# Patient Record
Sex: Female | Born: 1970 | Race: White | Hispanic: No | Marital: Married | Smoking: Former smoker
Health system: Southern US, Community
[De-identification: ages and names within clinical notes are randomized; demographics above are authoritative.]

## PROBLEM LIST (undated history)

## (undated) DIAGNOSIS — I10 Essential (primary) hypertension: Secondary | ICD-10-CM

## (undated) DIAGNOSIS — E7800 Pure hypercholesterolemia, unspecified: Secondary | ICD-10-CM

## (undated) DIAGNOSIS — E559 Vitamin D deficiency, unspecified: Secondary | ICD-10-CM

## (undated) DIAGNOSIS — G629 Polyneuropathy, unspecified: Secondary | ICD-10-CM

## (undated) DIAGNOSIS — M797 Fibromyalgia: Secondary | ICD-10-CM

## (undated) DIAGNOSIS — R011 Cardiac murmur, unspecified: Secondary | ICD-10-CM

## (undated) DIAGNOSIS — E119 Type 2 diabetes mellitus without complications: Secondary | ICD-10-CM

## (undated) DIAGNOSIS — E271 Primary adrenocortical insufficiency: Secondary | ICD-10-CM

## (undated) DIAGNOSIS — M539 Dorsopathy, unspecified: Secondary | ICD-10-CM

## (undated) DIAGNOSIS — N8300 Follicular cyst of ovary, unspecified side: Secondary | ICD-10-CM

## (undated) DIAGNOSIS — E063 Autoimmune thyroiditis: Secondary | ICD-10-CM

## (undated) DIAGNOSIS — Z973 Presence of spectacles and contact lenses: Secondary | ICD-10-CM

## (undated) DIAGNOSIS — F329 Major depressive disorder, single episode, unspecified: Secondary | ICD-10-CM

## (undated) DIAGNOSIS — D649 Anemia, unspecified: Secondary | ICD-10-CM

## (undated) DIAGNOSIS — M419 Scoliosis, unspecified: Secondary | ICD-10-CM

## (undated) DIAGNOSIS — M1990 Unspecified osteoarthritis, unspecified site: Secondary | ICD-10-CM

## (undated) HISTORY — PX: CHOLECYSTECTOMY: SHX55

---

## 2003-08-17 ENCOUNTER — Encounter: Admission: RE | Admit: 2003-08-17 | Discharge: 2003-11-15 | Payer: Self-pay | Admitting: Endocrinology

## 2004-08-08 ENCOUNTER — Ambulatory Visit: Payer: Self-pay | Admitting: Unknown Physician Specialty

## 2004-08-09 ENCOUNTER — Ambulatory Visit: Payer: Self-pay | Admitting: Unknown Physician Specialty

## 2005-07-03 ENCOUNTER — Ambulatory Visit: Payer: Self-pay | Admitting: Family Medicine

## 2005-08-24 ENCOUNTER — Ambulatory Visit: Payer: Self-pay | Admitting: Family Medicine

## 2005-11-16 ENCOUNTER — Ambulatory Visit: Payer: Self-pay | Admitting: Family Medicine

## 2005-11-19 ENCOUNTER — Encounter: Payer: Self-pay | Admitting: Family Medicine

## 2005-11-19 LAB — CONVERTED CEMR LAB: Hgb A1c MFr Bld: 6.7 %

## 2005-12-11 ENCOUNTER — Ambulatory Visit: Payer: Self-pay | Admitting: Family Medicine

## 2005-12-25 ENCOUNTER — Ambulatory Visit: Payer: Self-pay | Admitting: Family Medicine

## 2006-01-08 ENCOUNTER — Ambulatory Visit: Payer: Self-pay | Admitting: Unknown Physician Specialty

## 2006-01-15 ENCOUNTER — Ambulatory Visit: Payer: Self-pay | Admitting: Unknown Physician Specialty

## 2006-01-25 ENCOUNTER — Ambulatory Visit: Payer: Self-pay | Admitting: Family Medicine

## 2006-01-25 LAB — CONVERTED CEMR LAB: TSH: 1.997 microintl units/mL

## 2006-02-08 ENCOUNTER — Ambulatory Visit: Payer: Self-pay | Admitting: Family Medicine

## 2006-02-19 ENCOUNTER — Ambulatory Visit: Payer: Self-pay | Admitting: Family Medicine

## 2006-04-26 ENCOUNTER — Ambulatory Visit: Payer: Self-pay | Admitting: Family Medicine

## 2006-09-02 ENCOUNTER — Ambulatory Visit: Payer: Self-pay | Admitting: Family Medicine

## 2006-09-25 ENCOUNTER — Ambulatory Visit: Payer: Self-pay | Admitting: Family Medicine

## 2006-09-25 ENCOUNTER — Encounter: Payer: Self-pay | Admitting: Family Medicine

## 2006-09-25 DIAGNOSIS — R809 Proteinuria, unspecified: Secondary | ICD-10-CM | POA: Insufficient documentation

## 2006-09-25 DIAGNOSIS — E069 Thyroiditis, unspecified: Secondary | ICD-10-CM | POA: Insufficient documentation

## 2006-09-25 DIAGNOSIS — R599 Enlarged lymph nodes, unspecified: Secondary | ICD-10-CM | POA: Insufficient documentation

## 2006-09-25 DIAGNOSIS — F329 Major depressive disorder, single episode, unspecified: Secondary | ICD-10-CM

## 2006-09-25 DIAGNOSIS — E785 Hyperlipidemia, unspecified: Secondary | ICD-10-CM | POA: Insufficient documentation

## 2006-09-25 DIAGNOSIS — E063 Autoimmune thyroiditis: Secondary | ICD-10-CM | POA: Insufficient documentation

## 2006-09-25 DIAGNOSIS — N926 Irregular menstruation, unspecified: Secondary | ICD-10-CM | POA: Insufficient documentation

## 2006-09-25 DIAGNOSIS — F32A Depression, unspecified: Secondary | ICD-10-CM | POA: Insufficient documentation

## 2006-09-25 DIAGNOSIS — E119 Type 2 diabetes mellitus without complications: Secondary | ICD-10-CM | POA: Insufficient documentation

## 2006-09-25 DIAGNOSIS — Z87891 Personal history of nicotine dependence: Secondary | ICD-10-CM | POA: Insufficient documentation

## 2006-09-25 DIAGNOSIS — F419 Anxiety disorder, unspecified: Secondary | ICD-10-CM | POA: Insufficient documentation

## 2006-09-25 DIAGNOSIS — F3289 Other specified depressive episodes: Secondary | ICD-10-CM | POA: Insufficient documentation

## 2006-09-25 DIAGNOSIS — I10 Essential (primary) hypertension: Secondary | ICD-10-CM | POA: Insufficient documentation

## 2006-09-25 DIAGNOSIS — F411 Generalized anxiety disorder: Secondary | ICD-10-CM | POA: Insufficient documentation

## 2006-09-25 DIAGNOSIS — F4310 Post-traumatic stress disorder, unspecified: Secondary | ICD-10-CM | POA: Insufficient documentation

## 2006-09-25 DIAGNOSIS — R011 Cardiac murmur, unspecified: Secondary | ICD-10-CM | POA: Insufficient documentation

## 2006-09-25 DIAGNOSIS — J309 Allergic rhinitis, unspecified: Secondary | ICD-10-CM | POA: Insufficient documentation

## 2006-09-25 DIAGNOSIS — J45909 Unspecified asthma, uncomplicated: Secondary | ICD-10-CM | POA: Insufficient documentation

## 2006-09-25 LAB — CONVERTED CEMR LAB
Bilirubin Urine: NEGATIVE
Ketones, urine, test strip: NEGATIVE
Specific Gravity, Urine: 1.025
Urobilinogen, UA: 0.2
WBC Urine, dipstick: NEGATIVE

## 2006-09-26 ENCOUNTER — Telehealth: Payer: Self-pay | Admitting: Family Medicine

## 2006-11-12 ENCOUNTER — Telehealth (INDEPENDENT_AMBULATORY_CARE_PROVIDER_SITE_OTHER): Payer: Self-pay | Admitting: *Deleted

## 2006-11-13 ENCOUNTER — Encounter: Payer: Self-pay | Admitting: Family Medicine

## 2006-12-18 ENCOUNTER — Encounter (INDEPENDENT_AMBULATORY_CARE_PROVIDER_SITE_OTHER): Payer: Self-pay | Admitting: *Deleted

## 2007-04-15 ENCOUNTER — Ambulatory Visit: Payer: Self-pay | Admitting: Family Medicine

## 2007-05-05 ENCOUNTER — Ambulatory Visit: Payer: Self-pay | Admitting: Family Medicine

## 2007-05-05 LAB — CONVERTED CEMR LAB
Glucose, Urine, Semiquant: NEGATIVE
Mucus, UA: 0
Nitrite: NEGATIVE
Specific Gravity, Urine: >=1.03
Urine crystals, microscopic: 0 /hpf
Yeast, UA: 0
pH: 6

## 2007-05-06 ENCOUNTER — Encounter: Payer: Self-pay | Admitting: Family Medicine

## 2007-06-26 ENCOUNTER — Ambulatory Visit: Payer: Self-pay | Admitting: Family Medicine

## 2007-06-26 LAB — CONVERTED CEMR LAB: Glucose, Bld: 99 mg/dL

## 2007-07-01 ENCOUNTER — Telehealth: Payer: Self-pay | Admitting: Family Medicine

## 2007-08-20 ENCOUNTER — Telehealth: Payer: Self-pay | Admitting: Family Medicine

## 2007-08-21 ENCOUNTER — Ambulatory Visit: Payer: Self-pay | Admitting: Family Medicine

## 2007-09-09 ENCOUNTER — Ambulatory Visit: Payer: Self-pay | Admitting: Family Medicine

## 2007-09-09 DIAGNOSIS — R5381 Other malaise: Secondary | ICD-10-CM | POA: Insufficient documentation

## 2007-09-09 DIAGNOSIS — R5383 Other fatigue: Secondary | ICD-10-CM | POA: Insufficient documentation

## 2007-09-09 LAB — CONVERTED CEMR LAB
Bilirubin Urine: NEGATIVE
Casts: 0 /lpf
Glucose, Urine, Semiquant: NEGATIVE
Specific Gravity, Urine: <1.005
Urobilinogen, UA: 0.2
WBC Urine, dipstick: NEGATIVE
Yeast, UA: 0
pH: 6

## 2007-09-10 ENCOUNTER — Encounter: Payer: Self-pay | Admitting: Family Medicine

## 2007-09-12 LAB — CONVERTED CEMR LAB
Albumin: 4.1 g/dL (ref 3.5–5.2)
BUN: 11 mg/dL (ref 6–23)
CO2: 32 meq/L (ref 19–32)
Eosinophils Relative: 2.3 % (ref 0.0–5.0)
Free T4: 0.9 ng/dL (ref 0.6–1.6)
GFR calc non Af Amer: 86 mL/min
Glucose, Bld: 167 mg/dL — ABNORMAL HIGH (ref 70–99)
Hemoglobin: 13 g/dL (ref 12.0–15.0)
Lymphocytes Relative: 36 % (ref 12.0–46.0)
Monocytes Relative: 4.7 % (ref 3.0–12.0)
Neutro Abs: 3.7 10*3/uL (ref 1.4–7.7)
RBC: 4.96 M/uL (ref 3.87–5.11)
RDW: 12 % (ref 11.5–14.6)
Sodium: 139 meq/L (ref 135–145)
TSH: 1.34 microintl units/mL (ref 0.35–5.50)
WBC: 6.6 10*3/uL (ref 4.5–10.5)

## 2007-09-15 IMAGING — CT CT ABDOMEN AND PELVIS WITHOUT AND WITH CONTRAST
2 of 4 series · 14 of 32 positions shown, 19 images · non-contrast
Comparison: none

REASON FOR EXAM: General abdominal and pelvic pain  frequent UTI's
COMMENTS:

[Series 4: soft tissue with · axial · 0.73mm/px · z∈[-300,+68]mm · 8 of 60 slices shown, 13 images]
[im 7/60  soft-tissue]
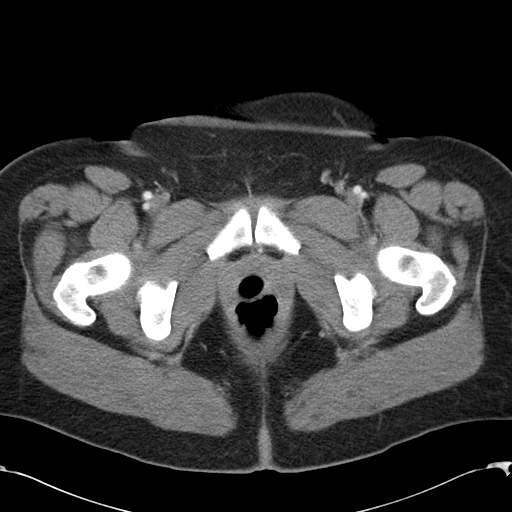
[im 7/60  bone]
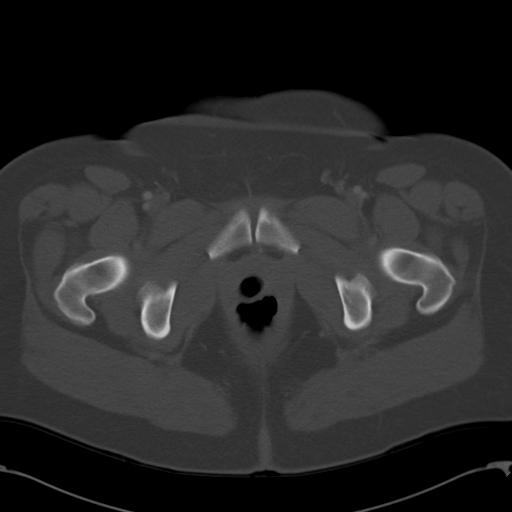
[im 14/60  soft-tissue]
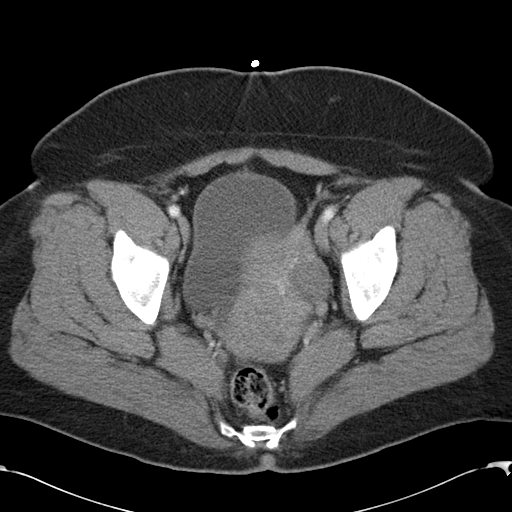
[im 20/60  soft-tissue]
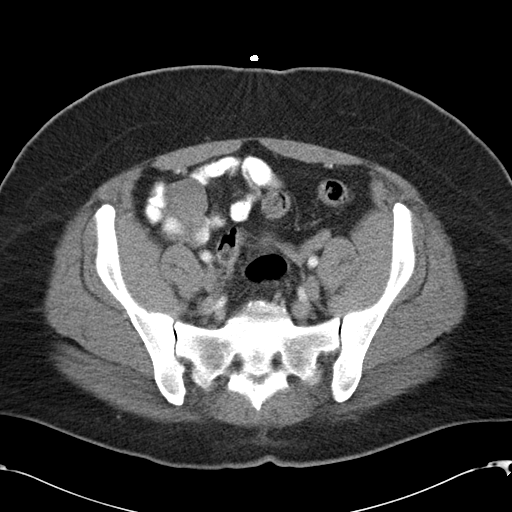
[im 27/60  soft-tissue]
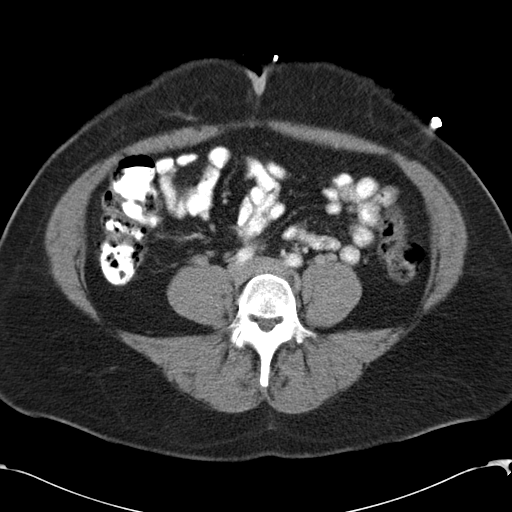
[im 33/60  soft-tissue]
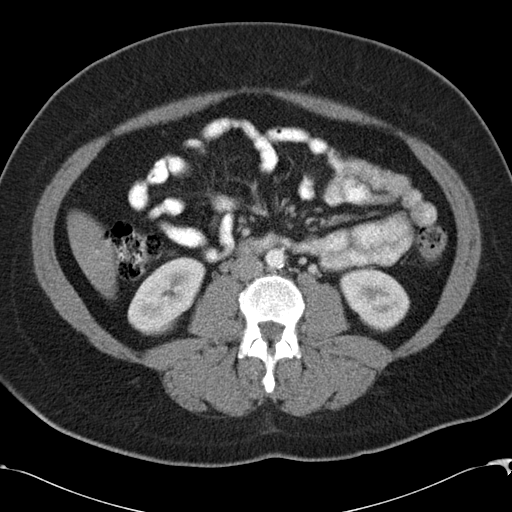
[im 33/60  lung]
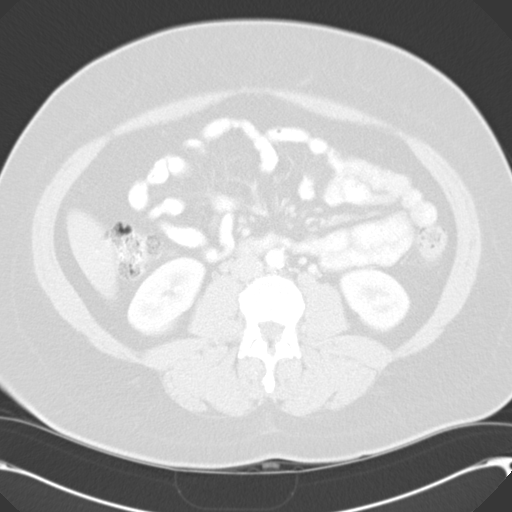
[im 40/60  soft-tissue]
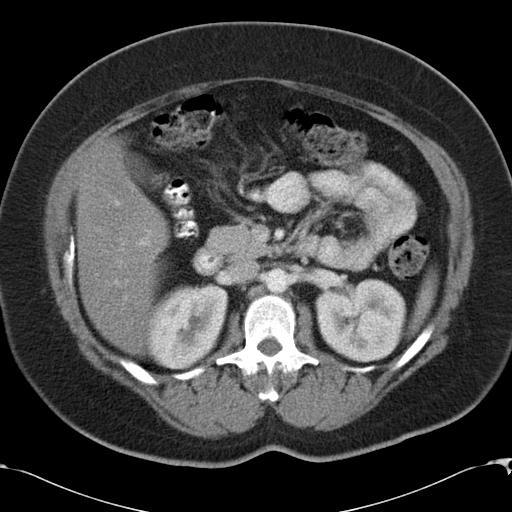
[im 40/60  lung]
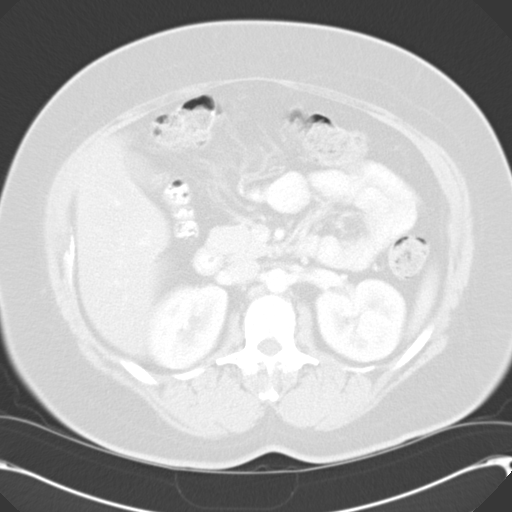
[im 46/60  soft-tissue]
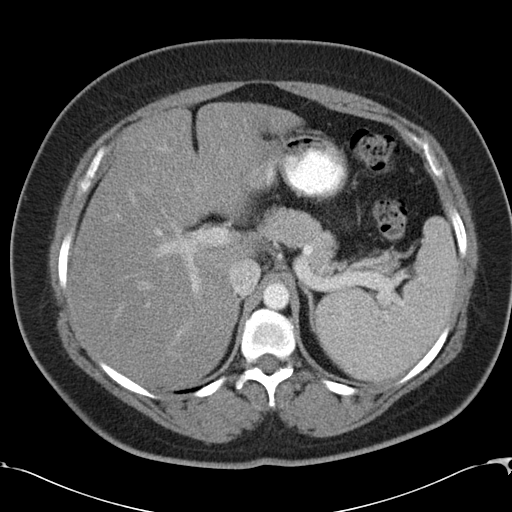
[im 46/60  lung]
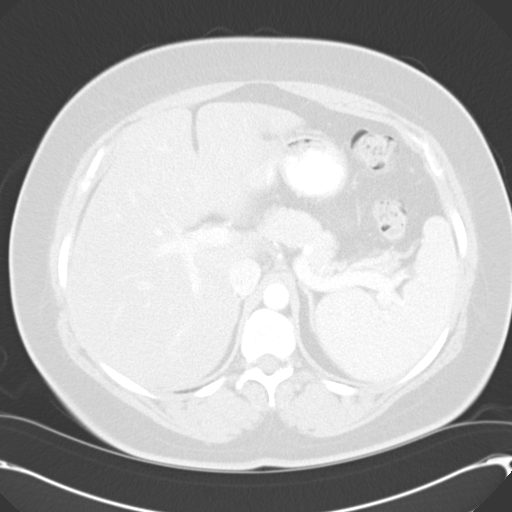
[im 53/60  soft-tissue]
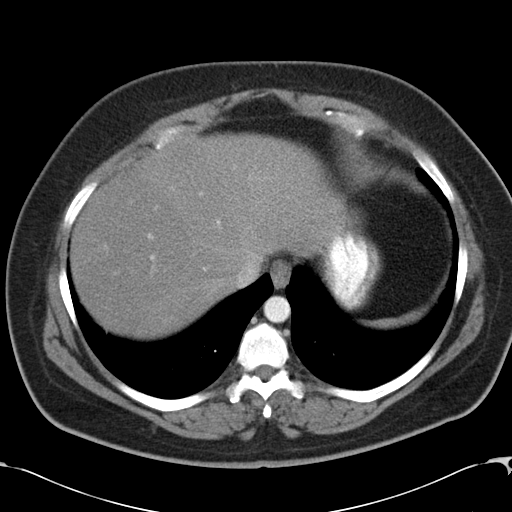
[im 53/60  lung]
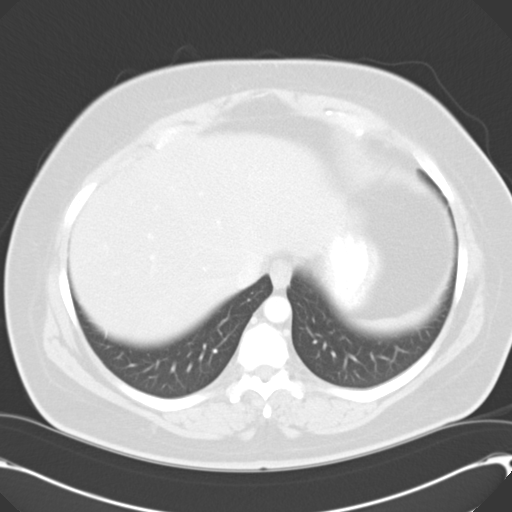

[Series 6: soft tissue delay · axial · delayed · 0.73mm/px · z∈[-300,-36]mm · 6 of 60 slices shown]
[im 7/60  soft-tissue]
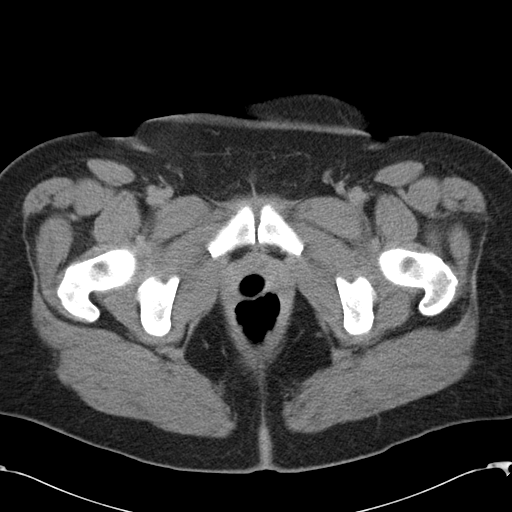
[im 14/60  soft-tissue]
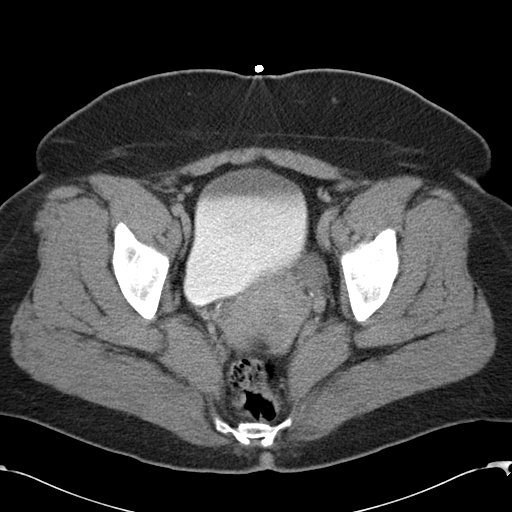
[im 20/60  soft-tissue]
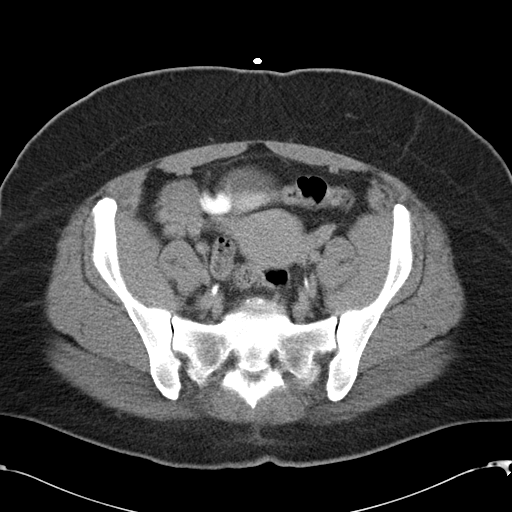
[im 27/60  soft-tissue]
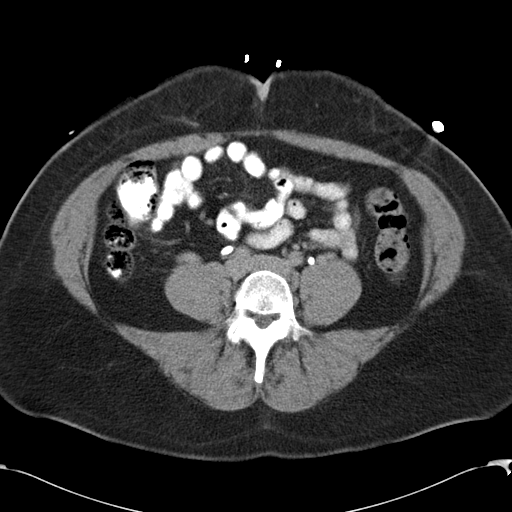
[im 33/60  soft-tissue]
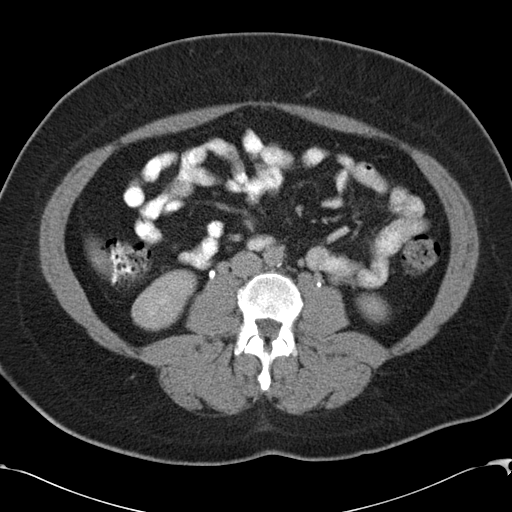
[im 40/60  soft-tissue]
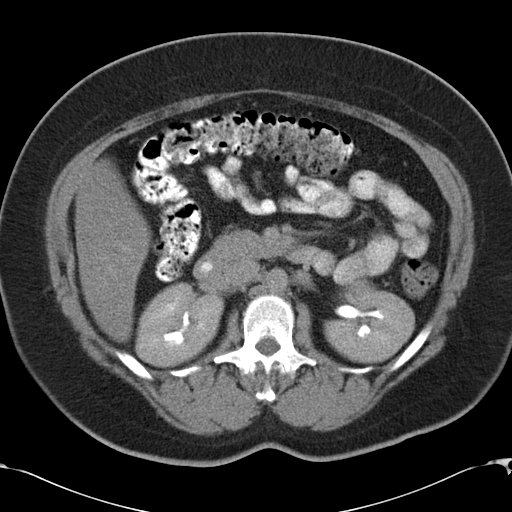

[14 of 32 positions shown; findings below may reference images not displayed]

PROCEDURE:     CT  - CT ABDOMEN / PELVIS  W/WO  - January 08, 2006  [DATE]

RESULT:       Spiral 8 mm sections were obtained from the lung bases through
the pubic symphysis pre, immediate and delay intravenous administration of
75 ml of Usovue-OCP and oral contrast.

Evaluation of the lung bases demonstrates no gross abnormalities.

The liver demonstrates a diffuse low attenuating architecture.   The spleen,
adrenals, pancreas and kidneys are unremarkable.  The celiac, SMA, IMA,
portal vein and SMV are patent.  There is no evidence of upper abdominal
free, drainable loculated fluid collections, masses or adenopathy.
Evaluation of the pelvis demonstrates a low attenuating mass within the
anterior RIGHT adnexal region.  This appears to be contiguous with the RIGHT
ovary and measures 3.0 x 2.9 cm.  This may represent an ovarian cyst.
Further evaluation with pelvic ultrasound is recommended.  Posteriorly
within the LEFT adnexal region, the LEFT ovary is identified also
demonstrating an associated low attenuating area measuring 2.9 x 3.0 cm
again possibly representing a cyst.  Further evaluation with pelvic
ultrasound recommended.  No further pelvic masses, free fluid, drainable
loculated fluid collections nor adenopathy is appreciated.
IMPRESSION: 1.     Findings possibly representing bilateral ovarian cysts.  Further
evaluation with pelvic ultrasound is recommended.
2.     No further pelvic or abdominal masses, free fluid nor drainable
loculated fluid collections are appreciated.

## 2008-02-17 ENCOUNTER — Telehealth: Payer: Self-pay | Admitting: Family Medicine

## 2008-02-20 ENCOUNTER — Ambulatory Visit: Payer: Self-pay | Admitting: Family Medicine

## 2008-02-20 LAB — CONVERTED CEMR LAB: Rapid Strep: NEGATIVE

## 2008-02-23 ENCOUNTER — Telehealth: Payer: Self-pay | Admitting: Family Medicine

## 2008-04-27 ENCOUNTER — Ambulatory Visit: Payer: Self-pay | Admitting: Family Medicine

## 2008-04-27 ENCOUNTER — Telehealth: Payer: Self-pay | Admitting: Family Medicine

## 2008-04-27 LAB — CONVERTED CEMR LAB
Casts: 0 /lpf
Ketones, urine, test strip: NEGATIVE
Nitrite: NEGATIVE
Urine crystals, microscopic: 0 /hpf
Yeast, UA: 0
pH: 6.5

## 2008-04-29 ENCOUNTER — Encounter: Payer: Self-pay | Admitting: Family Medicine

## 2008-05-27 ENCOUNTER — Ambulatory Visit: Payer: Self-pay | Admitting: Family Medicine

## 2008-05-27 DIAGNOSIS — R209 Unspecified disturbances of skin sensation: Secondary | ICD-10-CM | POA: Insufficient documentation

## 2008-05-27 DIAGNOSIS — M542 Cervicalgia: Secondary | ICD-10-CM | POA: Insufficient documentation

## 2008-06-02 LAB — CONVERTED CEMR LAB
ALT: 24 units/L (ref 0–35)
Bilirubin, Direct: 0.1 mg/dL (ref 0.0–0.3)
CO2: 30 meq/L (ref 19–32)
Calcium: 9.3 mg/dL (ref 8.4–10.5)
Chloride: 101 meq/L (ref 96–112)
Eosinophils Absolute: 0.2 10*3/uL (ref 0.0–0.7)
Eosinophils Relative: 2.8 % (ref 0.0–5.0)
Folate: 8.5 ng/mL
Monocytes Relative: 5.4 % (ref 3.0–12.0)
Neutrophils Relative %: 58.2 % (ref 43.0–77.0)
Platelets: 264 10*3/uL (ref 150–400)
Sed Rate: 9 mm/hr (ref 0–22)
Sodium: 137 meq/L (ref 135–145)
TSH: 6.31 microintl units/mL — ABNORMAL HIGH (ref 0.35–5.50)
Total Bilirubin: 0.8 mg/dL (ref 0.3–1.2)
Vit D, 1,25-Dihydroxy: 9 — ABNORMAL LOW (ref 30–89)
WBC: 7.3 10*3/uL (ref 4.5–10.5)

## 2008-06-22 ENCOUNTER — Ambulatory Visit: Payer: Self-pay | Admitting: Family Medicine

## 2008-06-22 DIAGNOSIS — E559 Vitamin D deficiency, unspecified: Secondary | ICD-10-CM | POA: Insufficient documentation

## 2008-08-03 ENCOUNTER — Telehealth: Payer: Self-pay | Admitting: Family Medicine

## 2008-08-06 ENCOUNTER — Ambulatory Visit: Payer: Self-pay | Admitting: Family Medicine

## 2008-08-09 LAB — CONVERTED CEMR LAB
Free T4: 0.7 ng/dL (ref 0.6–1.6)
TSH: 2.24 microintl units/mL (ref 0.35–5.50)

## 2008-08-12 ENCOUNTER — Encounter (INDEPENDENT_AMBULATORY_CARE_PROVIDER_SITE_OTHER): Payer: Self-pay | Admitting: *Deleted

## 2008-08-17 ENCOUNTER — Ambulatory Visit: Payer: Self-pay | Admitting: Family Medicine

## 2008-08-17 DIAGNOSIS — R300 Dysuria: Secondary | ICD-10-CM | POA: Insufficient documentation

## 2008-08-17 LAB — CONVERTED CEMR LAB
Bilirubin Urine: NEGATIVE
Casts: 0 /lpf
Nitrite: NEGATIVE
Urine crystals, microscopic: 0 /hpf
Urobilinogen, UA: 0.2
Yeast, UA: 0
pH: 6

## 2008-08-18 ENCOUNTER — Encounter: Payer: Self-pay | Admitting: Family Medicine

## 2009-08-24 ENCOUNTER — Emergency Department (HOSPITAL_COMMUNITY): Admission: EM | Admit: 2009-08-24 | Discharge: 2009-08-24 | Payer: Self-pay | Admitting: Emergency Medicine

## 2010-02-14 ENCOUNTER — Ambulatory Visit: Payer: Self-pay | Admitting: Internal Medicine

## 2010-08-21 LAB — URINALYSIS, ROUTINE W REFLEX MICROSCOPIC
Bilirubin Urine: NEGATIVE
Glucose, UA: NEGATIVE mg/dL
Ketones, ur: NEGATIVE mg/dL
Protein, ur: NEGATIVE mg/dL

## 2010-08-21 LAB — URINE MICROSCOPIC-ADD ON

## 2011-07-25 ENCOUNTER — Ambulatory Visit: Payer: Self-pay | Admitting: Internal Medicine

## 2011-10-03 ENCOUNTER — Ambulatory Visit: Payer: Self-pay | Admitting: Urology

## 2013-03-30 DIAGNOSIS — E271 Primary adrenocortical insufficiency: Secondary | ICD-10-CM | POA: Insufficient documentation

## 2013-03-30 DIAGNOSIS — E063 Autoimmune thyroiditis: Secondary | ICD-10-CM | POA: Insufficient documentation

## 2013-08-19 DIAGNOSIS — E669 Obesity, unspecified: Secondary | ICD-10-CM | POA: Insufficient documentation

## 2013-08-19 DIAGNOSIS — E66811 Obesity, class 1: Secondary | ICD-10-CM | POA: Insufficient documentation

## 2014-02-10 DIAGNOSIS — L732 Hidradenitis suppurativa: Secondary | ICD-10-CM | POA: Insufficient documentation

## 2014-05-18 DIAGNOSIS — F339 Major depressive disorder, recurrent, unspecified: Secondary | ICD-10-CM | POA: Insufficient documentation

## 2014-10-19 ENCOUNTER — Emergency Department
Admission: EM | Admit: 2014-10-19 | Discharge: 2014-10-20 | Disposition: A | Discharge status: Home / Self Care | Payer: BC Managed Care – PPO | Attending: Student | Admitting: Student

## 2014-10-19 ENCOUNTER — Encounter: Payer: Self-pay | Admitting: Emergency Medicine

## 2014-10-19 ENCOUNTER — Other Ambulatory Visit: Payer: Self-pay

## 2014-10-19 ENCOUNTER — Emergency Department: Payer: BC Managed Care – PPO

## 2014-10-19 DIAGNOSIS — R079 Chest pain, unspecified: Secondary | ICD-10-CM | POA: Insufficient documentation

## 2014-10-19 DIAGNOSIS — Z880 Allergy status to penicillin: Secondary | ICD-10-CM | POA: Insufficient documentation

## 2014-10-19 DIAGNOSIS — Z79899 Other long term (current) drug therapy: Secondary | ICD-10-CM | POA: Insufficient documentation

## 2014-10-19 DIAGNOSIS — Z87891 Personal history of nicotine dependence: Secondary | ICD-10-CM | POA: Insufficient documentation

## 2014-10-19 DIAGNOSIS — E119 Type 2 diabetes mellitus without complications: Secondary | ICD-10-CM | POA: Diagnosis not present

## 2014-10-19 DIAGNOSIS — R51 Headache: Secondary | ICD-10-CM | POA: Diagnosis not present

## 2014-10-19 DIAGNOSIS — Z3202 Encounter for pregnancy test, result negative: Secondary | ICD-10-CM | POA: Diagnosis not present

## 2014-10-19 DIAGNOSIS — I10 Essential (primary) hypertension: Secondary | ICD-10-CM | POA: Diagnosis not present

## 2014-10-19 DIAGNOSIS — R519 Headache, unspecified: Secondary | ICD-10-CM

## 2014-10-19 HISTORY — DX: Primary adrenocortical insufficiency: E27.1

## 2014-10-19 HISTORY — DX: Scoliosis, unspecified: M41.9

## 2014-10-19 HISTORY — DX: Essential (primary) hypertension: I10

## 2014-10-19 HISTORY — DX: Type 2 diabetes mellitus without complications: E11.9

## 2014-10-19 HISTORY — DX: Autoimmune thyroiditis: E06.3

## 2014-10-19 HISTORY — DX: Vitamin D deficiency, unspecified: E55.9

## 2014-10-19 HISTORY — DX: Major depressive disorder, single episode, unspecified: F32.9

## 2014-10-19 HISTORY — DX: Pure hypercholesterolemia, unspecified: E78.00

## 2014-10-19 HISTORY — DX: Fibromyalgia: M79.7

## 2014-10-19 LAB — BASIC METABOLIC PANEL
Anion gap: 7 (ref 5–15)
BUN: 8 mg/dL (ref 6–20)
CO2: 29 mmol/L (ref 22–32)
CREATININE: 0.85 mg/dL (ref 0.44–1.00)
Calcium: 9.2 mg/dL (ref 8.9–10.3)
Chloride: 101 mmol/L (ref 101–111)
Glucose, Bld: 192 mg/dL — ABNORMAL HIGH (ref 65–99)
POTASSIUM: 3.5 mmol/L (ref 3.5–5.1)
Sodium: 137 mmol/L (ref 135–145)

## 2014-10-19 LAB — TROPONIN I: Troponin I: <0.03 ng/mL (ref <0.031)

## 2014-10-19 LAB — CBC
HEMATOCRIT: 40.6 % (ref 35.0–47.0)
Hemoglobin: 13.6 g/dL (ref 12.0–16.0)
MCH: 25.8 pg — ABNORMAL LOW (ref 26.0–34.0)
MCHC: 33.5 g/dL (ref 32.0–36.0)
MCV: 76.8 fL — ABNORMAL LOW (ref 80.0–100.0)
PLATELETS: 312 10*3/uL (ref 150–440)
RBC: 5.29 MIL/uL — AB (ref 3.80–5.20)
RDW: 13.4 % (ref 11.5–14.5)
WBC: 10.3 10*3/uL (ref 3.6–11.0)

## 2014-10-19 LAB — POCT PREGNANCY, URINE: Preg Test, Ur: NEGATIVE

## 2014-10-19 MED ORDER — ONDANSETRON HCL 4 MG/2ML IJ SOLN
4.0000 mg | Freq: Once | INTRAMUSCULAR | Status: AC
  Administered 2014-10-19: 4 mg via INTRAVENOUS

## 2014-10-19 MED ORDER — ONDANSETRON HCL 4 MG/2ML IJ SOLN
INTRAMUSCULAR | Status: AC
Start: 2014-10-19 — End: 2014-10-19
  Administered 2014-10-19: 4 mg via INTRAVENOUS
  Filled 2014-10-19: qty 2

## 2014-10-19 MED ORDER — MORPHINE SULFATE 4 MG/ML IJ SOLN
INTRAMUSCULAR | Status: AC
  Administered 2014-10-19: 4 mg via INTRAVENOUS
  Filled 2014-10-19: qty 1

## 2014-10-19 MED ORDER — MORPHINE SULFATE 4 MG/ML IJ SOLN
4.0000 mg | Freq: Once | INTRAMUSCULAR | Status: AC
  Administered 2014-10-19: 4 mg via INTRAVENOUS

## 2014-10-19 NOTE — ED Notes (Signed)
Pt in with co chest pain states has had chest pain before with fibromyalgia but today her bp was elevated.

## 2014-10-19 NOTE — ED Provider Notes (Signed)
[facility] Emergency Department Provider Note  ____________________________________________  Time seen: Approximately 9:54 PM  I have reviewed the triage vital signs and the nursing notes.   HISTORY  Chief Complaint Chest Pain    HPI Abigail Kim is a 44 y.o. female with history of hypertension, diabetes, Hashimoto's disease, presents for evaluation of chest pain, headache, hypertension. She reports intermittent dull chest pains today since approximately 3 PM. She pain feels similar to acid reflux however she checked her blood pressure and it was "199/119" repetitively. She reports she rechecked it and it was even higher than that, she called her primary care doctor and the nurse advised she reports to the emergency department for evaluation. Currently she is not having any chest pain. The chest pain is not associated with any shortness of breath, not worsened with exertion, not associate with any sudden diaphoresis, nausea or vomiting. She reports she has also had occipital headache since ~3 PM it was gradual in onset, not associated with fever or neck stiffness. She reports that movement/position change makes her headache worse. She was recently treated with antibiotics for sinusitis but has otherwise been in her usual state of health.   Past Medical History  Diagnosis Date  . Hypertension   . Diabetes mellitus without complication   . Hashimoto's disease   . Hashimoto's disease   . Scoliosis   . Fibromyalgia   . Hypercholesteremia   . Addison's disease   . Major depression   . Vitamin D deficiency     Patient Active Problem List   Diagnosis Date Noted  . DYSURIA 08/17/2008  . UNSPECIFIED VITAMIN D DEFICIENCY 06/22/2008  . NECK PAIN 05/27/2008  . DISTURBANCE OF SKIN SENSATION 05/27/2008  . FATIGUE 09/09/2007  . THYROIDITIS 09/25/2006  . DIABETES MELLITUS, TYPE II 09/25/2006  . HYPERLIPIDEMIA 09/25/2006  . ANXIETY 09/25/2006  . PTSD 09/25/2006   . DEPRESSION 09/25/2006  . HYPERTENSION 09/25/2006  . ALLERGIC RHINITIS 09/25/2006  . REACTIVE AIRWAY DISEASE 09/25/2006  . IRREGULAR MENSES 09/25/2006  . MURMUR 09/25/2006  . CERVICAL LYMPHADENOPATHY 09/25/2006  . PROTEINURIA 09/25/2006  . TOBACCO USE, QUIT 09/25/2006    History reviewed. No pertinent past surgical history.  Current Outpatient Rx  Name  Route  Sig  Dispense  Refill  . albuterol (PROVENTIL HFA;VENTOLIN HFA) 108 (90 BASE) MCG/ACT inhaler   Inhalation   Inhale into the lungs every 6 (six) hours as needed for wheezing or shortness of breath.         Marland Kitchen. atomoxetine (STRATTERA) 18 MG capsule   Oral   Take 18 mg by mouth daily.         . benazepril (LOTENSIN) 40 MG tablet   Oral   Take 40 mg by mouth daily.         . benzonatate (TESSALON) 100 MG capsule   Oral   Take by mouth 3 (three) times daily as needed for cough.         . cholecalciferol (VITAMIN D) 400 UNITS TABS tablet   Oral   Take 400 Units by mouth.         . magnesium oxide (MAG-OX) 400 MG tablet   Oral   Take 400 mg by mouth daily.         . mupirocin ointment (BACTROBAN) 2 %   Nasal   Place 1 application into the nose 2 (two) times daily.         Marland Kitchen. omeprazole (PRILOSEC) 40 MG capsule  Oral   Take 40 mg by mouth daily.         . pravastatin (PRAVACHOL) 10 MG tablet   Oral   Take 10 mg by mouth daily.         . sitaGLIPtin (JANUVIA) 100 MG tablet   Oral   Take 100 mg by mouth daily.           Allergies Amoxicillin; Bupropion hcl; Duloxetine; Metformin; and Venlafaxine  History reviewed. No pertinent family history.  Social History History  Substance Use Topics  . Smoking status: Former Games developer  . Smokeless tobacco: Not on file  . Alcohol Use: Yes     Comment: Occasional     Review of Systems Constitutional: No fever/chills Eyes: No visual changes. ENT: No sore throat. Cardiovascular: + chest pain. Respiratory: Denies shortness of  breath. Gastrointestinal: No abdominal pain.  No nausea, no vomiting.  No diarrhea.  No constipation. Genitourinary: Negative for dysuria. Musculoskeletal: Negative for back pain. Skin: Negative for rash. Neurological: Negative for headaches, focal weakness or numbness.  10-point ROS otherwise negative.  ____________________________________________   PHYSICAL EXAM:  VITAL SIGNS: ED Triage Vitals  Enc Vitals Group     BP 10/19/14 2006 176/104 mmHg     Pulse Rate 10/19/14 2006 94     Resp 10/19/14 2006 18     Temp 10/19/14 2006 98 F (36.7 C)     Temp Source 10/19/14 2006 Oral     SpO2 10/19/14 2006 98 %     Weight 10/19/14 2003 221 lb (100.245 kg)     Height 10/19/14 2003  (1.676 m)     Head Cir --      Peak Flow --      Pain Score 10/19/14 2004 5     Pain Loc --      Pain Edu? --      Excl. in GC? --     Constitutional: Alert and oriented. Well appearing and in no acute distress. Eyes: Conjunctivae are normal. PERRL. EOMI. Head: Atraumatic. Nose: No congestion/rhinnorhea. Mouth/Throat: Mucous membranes are moist.  Oropharynx non-erythematous. Neck: No stridor.  Supple without meningitis. Cardiovascular: Normal rate, regular rhythm. Grossly normal heart sounds.  Good peripheral circulation. Respiratory: Normal respiratory effort.  No retractions. Lungs CTAB. Gastrointestinal: Soft and nontender. No distention. No abdominal bruits. No CVA tenderness. Genitourinary: deferred Musculoskeletal: No lower extremity tenderness nor edema.  No joint effusions. Neurologic:  Normal speech and language. No gross focal neurologic deficits are appreciated. Speech is normal. No gait instability. 5 out of 5 strength in bilateral upper and lower extremities, sensation intact to light touch throughout, cranial nerves II through XII intact. Skin:  Skin is warm, dry and intact. No rash noted. Psychiatric: Mood and affect are normal. Speech and behavior are  normal.  ____________________________________________   LABS (all labs ordered are listed, but only abnormal results are displayed)  Labs Reviewed  CBC - Abnormal; Notable for the following:    RBC 5.29 (*)    MCV 76.8 (*)    MCH 25.8 (*)    All other components within normal limits  BASIC METABOLIC PANEL - Abnormal; Notable for the following:    Glucose, Bld 192 (*)    All other components within normal limits  TROPONIN I  TROPONIN I  POC URINE PREG, ED  POCT PREGNANCY, URINE   ____________________________________________  EKG  ED ECG REPORT I, Gayla Doss, the attending physician, personally viewed and interpreted this ECG.   Date:  10/19/2014  EKG Time: 20:05  Rate: 100  Rhythm: normal EKG, normal sinus rhythm  Axis: Normal  Intervals:none  ST&T Change: None  ____________________________________________  RADIOLOGY  CXR: IMPRESSION: No active disease.  CT head IMPRESSION: No acute intracranial abnormality. ____________________________________________   PROCEDURES  Procedure(s) performed: None  Critical Care performed: No  ____________________________________________   INITIAL IMPRESSION / ASSESSMENT AND PLAN / ED COURSE  Pertinent labs & imaging results that were available during my care of the patient were reviewed by me and considered in my medical decision making (see chart for details).  AEVA POSEY is a 44 y.o. female with history of hypertension, diabetes, Hashimoto's disease, presents for evaluation of chest pain, headache, hypertension. On exam, she is generally well-appearing and in no acute distress. Her blood pressure is moderately elevated at ~170/100 but she is currently chest pain-free, has an intact neurological exam. Neck is supple without meningismus and I doubt meningitis or subarachnoid hemorrhage. EKG is reassuring, first troponin negative, HEART score of 2 makes ACS less likely. Not consistent with acute aortic dissection  or PE. Plan for pain control, reassess blood pressure, will obtain second set of cardiac enzymes and CT head. If workup unremarkable and symptoms improve, anticipate discharge home with expedient follow-up.  ----------------------------------------- 12:20 AM on 10/20/2014 -----------------------------------------  Headache improved somewhat, migraine cocktail ordered. CT head negative. Awaiting second troponin. Discussed with Dr. Park Breed who will see the patient clinic on 5/26 at 11 AM. Care to Dr. Manson Passey. BP improved with pain management. ____________________________________________   FINAL CLINICAL IMPRESSION(S) / ED DIAGNOSES  Final diagnoses:  Chest pain, unspecified chest pain type  Acute nonintractable headache, unspecified headache type  Essential hypertension      Gayla Doss, MD 10/20/14 0020

## 2014-10-20 LAB — TROPONIN I

## 2014-10-20 MED ORDER — ASPIRIN 81 MG PO CHEW
324.0000 mg | CHEWABLE_TABLET | Freq: Once | ORAL | Status: AC
  Administered 2014-10-20: 324 mg via ORAL

## 2014-10-20 MED ORDER — KETOROLAC TROMETHAMINE 30 MG/ML IJ SOLN
INTRAMUSCULAR | Status: AC
Start: 2014-10-20 — End: 2014-10-20
  Administered 2014-10-20: 30 mg via INTRAVENOUS
  Filled 2014-10-20: qty 1

## 2014-10-20 MED ORDER — METOCLOPRAMIDE HCL 5 MG/ML IJ SOLN
INTRAMUSCULAR | Status: AC
Start: 2014-10-20 — End: 2014-10-20
  Administered 2014-10-20: 10 mg via INTRAVENOUS
  Filled 2014-10-20: qty 2

## 2014-10-20 MED ORDER — KETOROLAC TROMETHAMINE 30 MG/ML IJ SOLN
30.0000 mg | Freq: Once | INTRAMUSCULAR | Status: AC
  Administered 2014-10-20: 30 mg via INTRAVENOUS

## 2014-10-20 MED ORDER — ASPIRIN 81 MG PO CHEW
CHEWABLE_TABLET | ORAL | Status: AC
  Administered 2014-10-20: 324 mg via ORAL
  Filled 2014-10-20: qty 4

## 2014-10-20 MED ORDER — METOCLOPRAMIDE HCL 5 MG/ML IJ SOLN
10.0000 mg | Freq: Once | INTRAMUSCULAR | Status: AC
  Administered 2014-10-20: 10 mg via INTRAVENOUS

## 2014-10-20 NOTE — ED Notes (Signed)
Patient with no complaints at this time. Respirations even and unlabored. Skin warm/dry. Discharge instructions reviewed with patient at this time. Patient given opportunity to voice concerns/ask questions. Patient discharged at this time and left Emergency Department with steady gait.   

## 2014-10-21 DIAGNOSIS — Z8249 Family history of ischemic heart disease and other diseases of the circulatory system: Secondary | ICD-10-CM | POA: Insufficient documentation

## 2014-10-26 DIAGNOSIS — Z8249 Family history of ischemic heart disease and other diseases of the circulatory system: Secondary | ICD-10-CM | POA: Insufficient documentation

## 2014-10-28 DIAGNOSIS — E1169 Type 2 diabetes mellitus with other specified complication: Secondary | ICD-10-CM | POA: Insufficient documentation

## 2014-10-28 DIAGNOSIS — E669 Obesity, unspecified: Secondary | ICD-10-CM

## 2014-11-10 DIAGNOSIS — G8929 Other chronic pain: Secondary | ICD-10-CM | POA: Insufficient documentation

## 2014-11-10 DIAGNOSIS — M549 Dorsalgia, unspecified: Secondary | ICD-10-CM

## 2014-11-10 DIAGNOSIS — M797 Fibromyalgia: Secondary | ICD-10-CM | POA: Insufficient documentation

## 2015-02-07 DIAGNOSIS — Z9884 Bariatric surgery status: Secondary | ICD-10-CM | POA: Insufficient documentation

## 2015-02-07 HISTORY — PX: GASTRIC BYPASS: SHX52

## 2015-05-29 HISTORY — PX: ENDOMETRIAL ABLATION: SHX621

## 2016-01-23 DIAGNOSIS — Z9049 Acquired absence of other specified parts of digestive tract: Secondary | ICD-10-CM | POA: Insufficient documentation

## 2016-02-04 ENCOUNTER — Inpatient Hospital Stay
Admission: EM | Admit: 2016-02-04 | Discharge: 2016-02-09 | DRG: 872 | Disposition: A | Discharge status: Home / Self Care | Payer: BC Managed Care – PPO | Attending: Internal Medicine | Admitting: Internal Medicine

## 2016-02-04 ENCOUNTER — Encounter: Payer: Self-pay | Admitting: Emergency Medicine

## 2016-02-04 DIAGNOSIS — E860 Dehydration: Secondary | ICD-10-CM | POA: Diagnosis present

## 2016-02-04 DIAGNOSIS — E119 Type 2 diabetes mellitus without complications: Secondary | ICD-10-CM | POA: Diagnosis present

## 2016-02-04 DIAGNOSIS — I10 Essential (primary) hypertension: Secondary | ICD-10-CM | POA: Diagnosis present

## 2016-02-04 DIAGNOSIS — E559 Vitamin D deficiency, unspecified: Secondary | ICD-10-CM | POA: Diagnosis present

## 2016-02-04 DIAGNOSIS — A09 Infectious gastroenteritis and colitis, unspecified: Secondary | ICD-10-CM

## 2016-02-04 DIAGNOSIS — R197 Diarrhea, unspecified: Secondary | ICD-10-CM

## 2016-02-04 DIAGNOSIS — J45909 Unspecified asthma, uncomplicated: Secondary | ICD-10-CM | POA: Diagnosis present

## 2016-02-04 DIAGNOSIS — K219 Gastro-esophageal reflux disease without esophagitis: Secondary | ICD-10-CM | POA: Diagnosis present

## 2016-02-04 DIAGNOSIS — Z888 Allergy status to other drugs, medicaments and biological substances status: Secondary | ICD-10-CM

## 2016-02-04 DIAGNOSIS — Z79899 Other long term (current) drug therapy: Secondary | ICD-10-CM | POA: Diagnosis not present

## 2016-02-04 DIAGNOSIS — Z87891 Personal history of nicotine dependence: Secondary | ICD-10-CM | POA: Diagnosis not present

## 2016-02-04 DIAGNOSIS — A419 Sepsis, unspecified organism: Principal | ICD-10-CM | POA: Diagnosis present

## 2016-02-04 DIAGNOSIS — F419 Anxiety disorder, unspecified: Secondary | ICD-10-CM | POA: Diagnosis present

## 2016-02-04 DIAGNOSIS — Z8619 Personal history of other infectious and parasitic diseases: Secondary | ICD-10-CM

## 2016-02-04 DIAGNOSIS — A047 Enterocolitis due to Clostridium difficile: Secondary | ICD-10-CM | POA: Diagnosis present

## 2016-02-04 DIAGNOSIS — Z833 Family history of diabetes mellitus: Secondary | ICD-10-CM

## 2016-02-04 DIAGNOSIS — E876 Hypokalemia: Secondary | ICD-10-CM | POA: Diagnosis not present

## 2016-02-04 DIAGNOSIS — K529 Noninfective gastroenteritis and colitis, unspecified: Secondary | ICD-10-CM

## 2016-02-04 DIAGNOSIS — Z8249 Family history of ischemic heart disease and other diseases of the circulatory system: Secondary | ICD-10-CM

## 2016-02-04 DIAGNOSIS — E785 Hyperlipidemia, unspecified: Secondary | ICD-10-CM | POA: Diagnosis present

## 2016-02-04 DIAGNOSIS — Z7984 Long term (current) use of oral hypoglycemic drugs: Secondary | ICD-10-CM

## 2016-02-04 DIAGNOSIS — A0472 Enterocolitis due to Clostridium difficile, not specified as recurrent: Secondary | ICD-10-CM | POA: Diagnosis present

## 2016-02-04 LAB — COMPREHENSIVE METABOLIC PANEL
ALT: 20 U/L (ref 14–54)
ANION GAP: 9 (ref 5–15)
AST: 21 U/L (ref 15–41)
Albumin: 3.8 g/dL (ref 3.5–5.0)
Alkaline Phosphatase: 52 U/L (ref 38–126)
BUN: 16 mg/dL (ref 6–20)
CHLORIDE: 105 mmol/L (ref 101–111)
CO2: 25 mmol/L (ref 22–32)
Calcium: 8.9 mg/dL (ref 8.9–10.3)
Creatinine, Ser: 0.58 mg/dL (ref 0.44–1.00)
GFR calc non Af Amer: >60 mL/min (ref >60)
Glucose, Bld: 146 mg/dL — ABNORMAL HIGH (ref 65–99)
Potassium: 4.4 mmol/L (ref 3.5–5.1)
SODIUM: 139 mmol/L (ref 135–145)
Total Bilirubin: 0.7 mg/dL (ref 0.3–1.2)
Total Protein: 7 g/dL (ref 6.5–8.1)

## 2016-02-04 LAB — GASTROINTESTINAL PANEL BY PCR, STOOL (REPLACES STOOL CULTURE)
ADENOVIRUS F40/41: NOT DETECTED
Astrovirus: NOT DETECTED
CAMPYLOBACTER SPECIES: NOT DETECTED
CRYPTOSPORIDIUM: NOT DETECTED
CYCLOSPORA CAYETANENSIS: NOT DETECTED
ENTAMOEBA HISTOLYTICA: NOT DETECTED
ENTEROPATHOGENIC E COLI (EPEC): NOT DETECTED
Enteroaggregative E coli (EAEC): NOT DETECTED
Enterotoxigenic E coli (ETEC): NOT DETECTED
Giardia lamblia: NOT DETECTED
Norovirus GI/GII: NOT DETECTED
PLESIMONAS SHIGELLOIDES: NOT DETECTED
Rotavirus A: NOT DETECTED
Salmonella species: NOT DETECTED
Sapovirus (I, II, IV, and V): NOT DETECTED
Shiga like toxin producing E coli (STEC): NOT DETECTED
Shigella/Enteroinvasive E coli (EIEC): NOT DETECTED
VIBRIO SPECIES: NOT DETECTED
Vibrio cholerae: NOT DETECTED
YERSINIA ENTEROCOLITICA: NOT DETECTED

## 2016-02-04 LAB — CBC
HEMATOCRIT: 48.2 % — AB (ref 35.0–47.0)
HEMOGLOBIN: 15.4 g/dL (ref 12.0–16.0)
MCH: 22.3 pg — AB (ref 26.0–34.0)
MCHC: 31.9 g/dL — ABNORMAL LOW (ref 32.0–36.0)
MCV: 69.9 fL — AB (ref 80.0–100.0)
Platelets: 511 10*3/uL — ABNORMAL HIGH (ref 150–440)
RBC: 6.9 MIL/uL — AB (ref 3.80–5.20)
RDW: 15.9 % — ABNORMAL HIGH (ref 11.5–14.5)
WBC: 28.1 10*3/uL — ABNORMAL HIGH (ref 3.6–11.0)

## 2016-02-04 LAB — C DIFFICILE QUICK SCREEN W PCR REFLEX
C DIFFICILE (CDIFF) INTERP: DETECTED
C Diff antigen: POSITIVE — AB
C Diff toxin: POSITIVE — AB

## 2016-02-04 LAB — GLUCOSE, CAPILLARY: Glucose-Capillary: 95 mg/dL (ref 65–99)

## 2016-02-04 LAB — LIPASE, BLOOD: LIPASE: 44 U/L (ref 11–51)

## 2016-02-04 MED ORDER — BENAZEPRIL HCL 20 MG PO TABS
40.0000 mg | ORAL_TABLET | Freq: Every day | ORAL | Status: DC
  Administered 2016-02-05 – 2016-02-06 (×2): 40 mg via ORAL
  Filled 2016-02-04 (×2): qty 2

## 2016-02-04 MED ORDER — MORPHINE SULFATE (PF) 4 MG/ML IV SOLN
4.0000 mg | Freq: Once | INTRAVENOUS | Status: AC
  Administered 2016-02-04: 4 mg via INTRAVENOUS
  Filled 2016-02-04: qty 1

## 2016-02-04 MED ORDER — ONDANSETRON HCL 4 MG PO TABS: 4.0000 mg | ORAL_TABLET | Freq: Four times a day (QID) | ORAL | Status: DC | PRN

## 2016-02-04 MED ORDER — ACETAMINOPHEN 325 MG PO TABS
650.0000 mg | ORAL_TABLET | Freq: Four times a day (QID) | ORAL | Status: DC | PRN
Start: 2016-02-04 — End: 2016-02-09
  Administered 2016-02-04: 650 mg via ORAL
  Filled 2016-02-04: qty 2

## 2016-02-04 MED ORDER — SODIUM CHLORIDE 0.9 % IV BOLUS (SEPSIS)
1000.0000 mL | Freq: Once | INTRAVENOUS | Status: AC
  Administered 2016-02-04: 1000 mL via INTRAVENOUS

## 2016-02-04 MED ORDER — PANTOPRAZOLE SODIUM 40 MG PO TBEC
40.0000 mg | DELAYED_RELEASE_TABLET | Freq: Every day | ORAL | Status: DC
  Administered 2016-02-05 – 2016-02-09 (×5): 40 mg via ORAL
  Filled 2016-02-04 (×5): qty 1

## 2016-02-04 MED ORDER — VANCOMYCIN 50 MG/ML ORAL SOLUTION
125.0000 mg | Freq: Four times a day (QID) | ORAL | Status: DC
  Administered 2016-02-04 – 2016-02-09 (×19): 125 mg via ORAL
  Filled 2016-02-04 (×21): qty 2.5

## 2016-02-04 MED ORDER — MORPHINE SULFATE (PF) 4 MG/ML IV SOLN
4.0000 mg | Freq: Once | INTRAVENOUS | Status: AC
  Administered 2016-02-04: 4 mg via INTRAVENOUS

## 2016-02-04 MED ORDER — ONDANSETRON HCL 4 MG/2ML IJ SOLN
4.0000 mg | Freq: Four times a day (QID) | INTRAMUSCULAR | Status: DC | PRN
  Administered 2016-02-05 (×2): 4 mg via INTRAVENOUS
  Filled 2016-02-04 (×2): qty 2

## 2016-02-04 MED ORDER — INSULIN ASPART 100 UNIT/ML ~~LOC~~ SOLN: 0.0000 [IU] | Freq: Every day | SUBCUTANEOUS | Status: DC

## 2016-02-04 MED ORDER — ACETAMINOPHEN 650 MG RE SUPP
650.0000 mg | Freq: Four times a day (QID) | RECTAL | Status: DC | PRN
Start: 2016-02-04 — End: 2016-02-09

## 2016-02-04 MED ORDER — MORPHINE SULFATE (PF) 4 MG/ML IV SOLN
INTRAVENOUS | Status: AC
  Administered 2016-02-04: 4 mg via INTRAVENOUS
  Filled 2016-02-04: qty 1

## 2016-02-04 MED ORDER — SODIUM CHLORIDE 0.9% FLUSH
3.0000 mL | Freq: Two times a day (BID) | INTRAVENOUS | Status: DC
  Administered 2016-02-04 – 2016-02-08 (×4): 3 mL via INTRAVENOUS

## 2016-02-04 MED ORDER — ENOXAPARIN SODIUM 40 MG/0.4ML ~~LOC~~ SOLN
40.0000 mg | SUBCUTANEOUS | Status: DC
  Administered 2016-02-05 – 2016-02-08 (×4): 40 mg via SUBCUTANEOUS
  Filled 2016-02-04 (×4): qty 0.4

## 2016-02-04 MED ORDER — INSULIN ASPART 100 UNIT/ML ~~LOC~~ SOLN
0.0000 [IU] | Freq: Three times a day (TID) | SUBCUTANEOUS | Status: DC
  Administered 2016-02-05: 2 [IU] via SUBCUTANEOUS
  Administered 2016-02-05: 1 [IU] via SUBCUTANEOUS
  Filled 2016-02-04: qty 2
  Filled 2016-02-04: qty 1

## 2016-02-04 MED ORDER — SODIUM CHLORIDE 0.9 % IV SOLN
INTRAVENOUS | Status: AC
  Administered 2016-02-04: 23:00:00 via INTRAVENOUS

## 2016-02-04 MED ORDER — ONDANSETRON HCL 4 MG/2ML IJ SOLN
4.0000 mg | Freq: Once | INTRAMUSCULAR | Status: AC
Start: 2016-02-04 — End: 2016-02-04
  Administered 2016-02-04: 4 mg via INTRAVENOUS
  Filled 2016-02-04: qty 2

## 2016-02-04 MED ORDER — PRAVASTATIN SODIUM 10 MG PO TABS
10.0000 mg | ORAL_TABLET | Freq: Every day | ORAL | Status: DC
  Filled 2016-02-04 (×2): qty 1

## 2016-02-04 NOTE — H&P (Signed)
[facility] - [place] at [facility]   PATIENT NAME: Abigail Kim    MR#:  [id number]  DATE OF BIRTH:  [birth year]  DATE OF ADMISSION:  02/04/2016  PRIMARY CARE PHYSICIAN: Court Joy, PA-C   REQUESTING/REFERRING PHYSICIAN: Lenard Lance, MD  CHIEF COMPLAINT:   Chief Complaint  Patient presents with  . Diarrhea  . Abdominal Pain    HISTORY OF PRESENT ILLNESS:  Abigail Kim  is a 45 y.o. female who presents with Abdominal pain and diarrhea for the last 3-4 days. She recently had gallbladder surgery and was given antibiotics during the course of her hospital stay. Afterwards she developed her diarrhea. She has a history of C. difficile infection the past, which was uncomplicated per her report. Evaluation in the ED today shows her to be C. difficile positive again. Hospitals were called for admission  PAST MEDICAL HISTORY:   Past Medical History:  Diagnosis Date  . Addison's disease (HCC)   . Diabetes mellitus without complication (HCC)   . Fibromyalgia   . Hashimoto's disease   . Hashimoto's disease   . Hypercholesteremia   . Hypertension   . Major depression (HCC)   . Scoliosis   . Vitamin D deficiency     PAST SURGICAL HISTORY:   Past Surgical History:  Procedure Laterality Date  . CHOLECYSTECTOMY    . GASTRIC BYPASS  02/07/2015    SOCIAL HISTORY:   Social History  Substance Use Topics  . Smoking status: Former Games developer  . Smokeless tobacco: Never Used  . Alcohol use Yes     Comment: Occasional     FAMILY HISTORY:   Family History  Problem Relation Age of Onset  . Cirrhosis Brother   . Diabetes Father   . Heart disease Father   . Stroke Father     DRUG ALLERGIES:   Allergies  Allergen Reactions  . Amoxicillin Diarrhea and Nausea And Vomiting  . Bupropion Hcl Other (See Comments)    REACTION: hallucinations  . Duloxetine Other (See Comments)    REACTION: dizziness  . Metformin Diarrhea  . Venlafaxine Other  (See Comments)    REACTION: hallucinations    MEDICATIONS AT HOME:   Prior to Admission medications   Medication Sig Start Date End Date Taking? Authorizing Provider  albuterol (PROVENTIL HFA;VENTOLIN HFA) 108 (90 BASE) MCG/ACT inhaler Inhale into the lungs every 6 (six) hours as needed for wheezing or shortness of breath.    Historical Provider, MD  atomoxetine (STRATTERA) 18 MG capsule Take 18 mg by mouth daily.    Historical Provider, MD  benazepril (LOTENSIN) 40 MG tablet Take 40 mg by mouth daily.    Historical Provider, MD  benzonatate (TESSALON) 100 MG capsule Take by mouth 3 (three) times daily as needed for cough.    Historical Provider, MD  cholecalciferol (VITAMIN D) 400 UNITS TABS tablet Take 400 Units by mouth.    Historical Provider, MD  magnesium oxide (MAG-OX) 400 MG tablet Take 400 mg by mouth daily.    Historical Provider, MD  mupirocin ointment (BACTROBAN) 2 % Place 1 application into the nose 2 (two) times daily.    Historical Provider, MD  omeprazole (PRILOSEC) 40 MG capsule Take 40 mg by mouth daily.    Historical Provider, MD  pravastatin (PRAVACHOL) 10 MG tablet Take 10 mg by mouth daily.    Historical Provider, MD  sitaGLIPtin (JANUVIA) 100 MG tablet Take 100 mg by mouth daily.    Historical Provider, MD    REVIEW  OF SYSTEMS:  Review of Systems  Constitutional: Positive for chills. Negative for fever, malaise/fatigue and weight loss.  HENT: Negative for ear pain, hearing loss and tinnitus.   Eyes: Negative for blurred vision, double vision, pain and redness.  Respiratory: Negative for cough, hemoptysis and shortness of breath.   Cardiovascular: Negative for chest pain, palpitations, orthopnea and leg swelling.  Gastrointestinal: Positive for abdominal pain, diarrhea and nausea. Negative for constipation and vomiting.  Genitourinary: Negative for dysuria, frequency and hematuria.  Musculoskeletal: Negative for back pain, joint pain and neck pain.  Skin:       No  acne, rash, or lesions  Neurological: Negative for dizziness, tremors, focal weakness and weakness.  Endo/Heme/Allergies: Negative for polydipsia. Does not bruise/bleed easily.  Psychiatric/Behavioral: Negative for depression. The patient is not nervous/anxious and does not have insomnia.      VITAL SIGNS:   Vitals:   02/04/16 1713 02/04/16 1855 02/04/16 1930 02/04/16 2005  BP: 102/73 (!) 138/98 124/82 123/81  Pulse: (!) 102 79 72 75  Resp: 20 18  17   Temp: 97.5 F (36.4 C)     TempSrc: Oral     SpO2: 99% 100% 100% 100%  Weight: 62.1 kg (137 lb)     Height: 5\' 6"  (1.676 m)      Wt Readings from Last 3 Encounters:  02/04/16 62.1 kg (137 lb)  10/19/14 100.2 kg (221 lb)  06/22/08 (!) 105.2 kg (232 lb)    PHYSICAL EXAMINATION:  Physical Exam  Vitals reviewed. Constitutional: She is oriented to person, place, and time. She appears well-developed and well-nourished. No distress.  HENT:  Head: Normocephalic and atraumatic.  Dry mucous membranes  Eyes: Conjunctivae and EOM are normal. Pupils are equal, round, and reactive to light. No scleral icterus.  Neck: Normal range of motion. Neck supple. No JVD present. No thyromegaly present.  Cardiovascular: Normal rate, regular rhythm and intact distal pulses.  Exam reveals no gallop and no friction rub.   No murmur heard. Respiratory: Effort normal and breath sounds normal. No respiratory distress. She has no wheezes. She has no rales.  GI: Soft. Bowel sounds are normal. She exhibits no distension. There is tenderness.  Musculoskeletal: Normal range of motion. She exhibits no edema.  No arthritis, no gout  Lymphadenopathy:    She has no cervical adenopathy.  Neurological: She is alert and oriented to person, place, and time. No cranial nerve deficit.  No dysarthria, no aphasia  Skin: Skin is warm and dry. No rash noted. No erythema.  Psychiatric: She has a normal mood and affect. Her behavior is normal. Judgment and thought content  normal.    LABORATORY PANEL:   CBC  Recent Labs Lab 02/04/16 1723  WBC 28.1*  HGB 15.4  HCT 48.2*  PLT 511*   ------------------------------------------------------------------------------------------------------------------  Chemistries   Recent Labs Lab 02/04/16 1723  NA 139  K 4.4  CL 105  CO2 25  GLUCOSE 146*  BUN 16  CREATININE 0.58  CALCIUM 8.9  AST 21  ALT 20  ALKPHOS 52  BILITOT 0.7   ------------------------------------------------------------------------------------------------------------------  Cardiac Enzymes No results for input(s): TROPONINI in the last 168 hours. ------------------------------------------------------------------------------------------------------------------  RADIOLOGY:  No results found.  EKG:   Orders placed or performed during the hospital encounter of 02/04/16  . ED EKG  . ED EKG  . EKG 12-Lead  . EKG 12-Lead    IMPRESSION AND PLAN:  Principal Problem:   C. difficile colitis - severe C. difficile given her white  count of 20,000. By mouth vancomycin started in the ED, we will continue this on admission. GI consult. Renal function is within normal limits Active Problems:   Diabetes (HCC) - sliding scale insulin with corresponding glucose checks   Anxiety - continue home meds   Essential hypertension - continue home meds   HLD (hyperlipidemia) - home meds   GERD (gastroesophageal reflux disease) - home dose PPI  All the records are reviewed and case discussed with ED provider. Management plans discussed with the patient and/or family.  DVT PROPHYLAXIS: SubQ lovenox  GI PROPHYLAXIS: PPI  ADMISSION STATUS: Inpatient  CODE STATUS: Full Code Status History    This patient does not have a recorded code status. Please follow your organizational policy for patients in this situation.      TOTAL TIME TAKING CARE OF THIS PATIENT: 45 minutes.    Sly Parlee FIELDING 02/04/2016, 9:16 PM  [facility]  Office  [phone]  CC: Primary care physician; Court JoyIMBROOK-DILLOW,KAREN M, PA-C

## 2016-02-04 NOTE — ED Notes (Signed)
Report given to floor RN. Pt taken to floor via stretcher. Vital signs stable prior to transport.  

## 2016-02-04 NOTE — ED Triage Notes (Signed)
Pt states she has her gallbladder removed on August 18th. States last Saturday sick with heaves and diarrhea. Ok on Thursday then woke up this morning with heaves, cold sweats, diarrhea about 15 times today.

## 2016-02-04 NOTE — ED Provider Notes (Signed)
[facility] Emergency Department Provider Note  Time seen: 6:37 PM  I have reviewed the triage vital signs and the nursing notes.   HISTORY  Chief Complaint Diarrhea and Abdominal Pain    HPI Abigail Kim is a 45 y.o. female with a past medical history of diabetes, fibromyalgia, hypertension, hyperlipidemia, status post gastric bypass, recent cholecystectomy who presents to the emergency department with nausea, vomiting, diarrhea, lower abdominal cramping. According to the patient she has been suffering intermittent nausea vomiting and diarrhea since her cholecystectomy 3 weeks ago. She states over the past 3 days her symptoms have worsened, she is having 15+ bowel movements per day which she describes as a yellow mucousy stool. Denies any black or blood. States a history of C. difficile in 2016 and states this feels similar. States lower abdominal cramping which she describes as moderate, which is relieved with bowel movements. States moderate nausea currently.  Past Medical History:  Diagnosis Date  . Addison's disease (HCC)   . Diabetes mellitus without complication (HCC)   . Fibromyalgia   . Hashimoto's disease   . Hashimoto's disease   . Hypercholesteremia   . Hypertension   . Major depression (HCC)   . Scoliosis   . Vitamin D deficiency     Patient Active Problem List   Diagnosis Date Noted  . DYSURIA 08/17/2008  . UNSPECIFIED VITAMIN D DEFICIENCY 06/22/2008  . NECK PAIN 05/27/2008  . DISTURBANCE OF SKIN SENSATION 05/27/2008  . FATIGUE 09/09/2007  . THYROIDITIS 09/25/2006  . DIABETES MELLITUS, TYPE II 09/25/2006  . HYPERLIPIDEMIA 09/25/2006  . ANXIETY 09/25/2006  . PTSD 09/25/2006  . DEPRESSION 09/25/2006  . HYPERTENSION 09/25/2006  . ALLERGIC RHINITIS 09/25/2006  . REACTIVE AIRWAY DISEASE 09/25/2006  . IRREGULAR MENSES 09/25/2006  . MURMUR 09/25/2006  . CERVICAL LYMPHADENOPATHY 09/25/2006  . PROTEINURIA 09/25/2006  . TOBACCO USE,  QUIT 09/25/2006    Past Surgical History:  Procedure Laterality Date  . CHOLECYSTECTOMY    . GASTRIC BYPASS  02/07/2015    Prior to Admission medications   Medication Sig Start Date End Date Taking? Authorizing Provider  albuterol (PROVENTIL HFA;VENTOLIN HFA) 108 (90 BASE) MCG/ACT inhaler Inhale into the lungs every 6 (six) hours as needed for wheezing or shortness of breath.    Historical Provider, MD  atomoxetine (STRATTERA) 18 MG capsule Take 18 mg by mouth daily.    Historical Provider, MD  benazepril (LOTENSIN) 40 MG tablet Take 40 mg by mouth daily.    Historical Provider, MD  benzonatate (TESSALON) 100 MG capsule Take by mouth 3 (three) times daily as needed for cough.    Historical Provider, MD  cholecalciferol (VITAMIN D) 400 UNITS TABS tablet Take 400 Units by mouth.    Historical Provider, MD  magnesium oxide (MAG-OX) 400 MG tablet Take 400 mg by mouth daily.    Historical Provider, MD  mupirocin ointment (BACTROBAN) 2 % Place 1 application into the nose 2 (two) times daily.    Historical Provider, MD  omeprazole (PRILOSEC) 40 MG capsule Take 40 mg by mouth daily.    Historical Provider, MD  pravastatin (PRAVACHOL) 10 MG tablet Take 10 mg by mouth daily.    Historical Provider, MD  sitaGLIPtin (JANUVIA) 100 MG tablet Take 100 mg by mouth daily.    Historical Provider, MD    Allergies  Allergen Reactions  . Amoxicillin Diarrhea and Nausea And Vomiting  . Bupropion Hcl Other (See Comments)    REACTION: hallucinations  . Duloxetine Other (See  Comments)    REACTION: dizziness  . Metformin Diarrhea  . Venlafaxine Other (See Comments)    REACTION: hallucinations    No family history on file.  Social History Social History  Substance Use Topics  . Smoking status: Former Games developermoker  . Smokeless tobacco: Never Used  . Alcohol use Yes     Comment: Occasional     Review of Systems Constitutional: Negative for fever. Cardiovascular: Negative for chest pain. Respiratory:  Negative for shortness of breath. Gastrointestinal: Lower abdominal cramping. Positive for nausea, vomiting, diarrhea. Genitourinary: Negative for dysuria. Neurological: Negative for headache 10-point ROS otherwise negative.  ____________________________________________   PHYSICAL EXAM:  VITAL SIGNS: ED Triage Vitals  Enc Vitals Group     BP 02/04/16 1713 102/73     Pulse Rate 02/04/16 1713 (!) 102     Resp 02/04/16 1713 20     Temp 02/04/16 1713 97.5 F (36.4 C)     Temp Source 02/04/16 1713 Oral     SpO2 02/04/16 1713 99 %     Weight 02/04/16 1713 137 lb (62.1 kg)     Height 02/04/16 1713 5\' 6"  (1.676 m)     Head Circumference --      Peak Flow --      Pain Score 02/04/16 1718 5     Pain Loc --      Pain Edu? --      Excl. in GC? --     Constitutional: Alert and oriented. Well appearing and in no distress. Eyes: Normal exam ENT   Head: Normocephalic and atraumatic.   Mouth/Throat: Mucous membranes are moist. Cardiovascular: Normal rate, regular rhythm. No murmurs,  Respiratory: Normal respiratory effort without tachypnea nor retractions. Breath sounds are clear  Gastrointestinal: Soft, mild lower abdominal tenderness palpation, no rebound or guarding. Hyperactive bowel sounds. Musculoskeletal: Nontender with normal range of motion in all extremities.  Neurologic:  Normal speech and language. No gross focal neurologic deficits  Skin:  Skin is warm, dry and intact. Somewhat pale in appearance. Psychiatric: Mood and affect are normal.  ____________________________________________   INITIAL IMPRESSION / ASSESSMENT AND PLAN / ED COURSE  Pertinent labs & imaging results that were available during my care of the patient were reviewed by me and considered in my medical decision making (see chart for details).  The patient presents to the emergency department with frequent bowel movements, nausea, vomiting, diarrhea lower abdominal cramping. Patient's lab show  significant leukocytosis of 28,000. We will place the patient on contact precautions, send a C. difficile test as well as a stool culture. We will start IV fluids, treat with Zofran, and dose morphine for abdominal cramping.  Patient C. difficile test is positive. We will admit to the hospital for continued IV fluids as well as oral vancomycin treatment. Patient agreeable to plan.  ____________________________________________   FINAL CLINICAL IMPRESSION(S) / ED DIAGNOSES  Dehydration Diarrhea Clostridium difficile    Minna AntisKevin Landen Breeland, MD 02/04/16 2034

## 2016-02-05 ENCOUNTER — Inpatient Hospital Stay: Payer: BC Managed Care – PPO

## 2016-02-05 ENCOUNTER — Encounter: Payer: Self-pay | Admitting: Radiology

## 2016-02-05 DIAGNOSIS — A047 Enterocolitis due to Clostridium difficile: Secondary | ICD-10-CM

## 2016-02-05 LAB — URINALYSIS COMPLETE WITH MICROSCOPIC (ARMC ONLY)
BILIRUBIN URINE: NEGATIVE
GLUCOSE, UA: NEGATIVE mg/dL
HGB URINE DIPSTICK: NEGATIVE
LEUKOCYTES UA: NEGATIVE
Nitrite: NEGATIVE
Protein, ur: NEGATIVE mg/dL
SPECIFIC GRAVITY, URINE: 1.023 (ref 1.005–1.030)
pH: 5 (ref 5.0–8.0)

## 2016-02-05 LAB — CBC
HEMATOCRIT: 40.3 % (ref 35.0–47.0)
HEMOGLOBIN: 12.8 g/dL (ref 12.0–16.0)
MCH: 22 pg — ABNORMAL LOW (ref 26.0–34.0)
MCHC: 31.8 g/dL — ABNORMAL LOW (ref 32.0–36.0)
MCV: 69.1 fL — ABNORMAL LOW (ref 80.0–100.0)
Platelets: 460 10*3/uL — ABNORMAL HIGH (ref 150–440)
RBC: 5.83 MIL/uL — ABNORMAL HIGH (ref 3.80–5.20)
RDW: 15.9 % — ABNORMAL HIGH (ref 11.5–14.5)
WBC: 30.8 10*3/uL — AB (ref 3.6–11.0)

## 2016-02-05 LAB — GLUCOSE, CAPILLARY
GLUCOSE-CAPILLARY: 152 mg/dL — AB (ref 65–99)
Glucose-Capillary: 127 mg/dL — ABNORMAL HIGH (ref 65–99)
Glucose-Capillary: 110 mg/dL — ABNORMAL HIGH (ref 65–99)
Glucose-Capillary: 118 mg/dL — ABNORMAL HIGH (ref 65–99)

## 2016-02-05 LAB — BASIC METABOLIC PANEL
ANION GAP: 7 (ref 5–15)
BUN: 13 mg/dL (ref 6–20)
CHLORIDE: 108 mmol/L (ref 101–111)
CO2: 22 mmol/L (ref 22–32)
Calcium: 8.2 mg/dL — ABNORMAL LOW (ref 8.9–10.3)
Creatinine, Ser: 0.51 mg/dL (ref 0.44–1.00)
GFR calc Af Amer: >60 mL/min (ref >60)
GLUCOSE: 139 mg/dL — AB (ref 65–99)
POTASSIUM: 4.6 mmol/L (ref 3.5–5.1)
SODIUM: 137 mmol/L (ref 135–145)

## 2016-02-05 LAB — PREGNANCY, URINE: Preg Test, Ur: NEGATIVE

## 2016-02-05 MED ORDER — CHOLESTYRAMINE 4 G PO PACK
4.0000 g | PACK | Freq: Three times a day (TID) | ORAL | Status: DC
  Administered 2016-02-05 (×2): 4 g via ORAL
  Filled 2016-02-05 (×5): qty 1

## 2016-02-05 MED ORDER — PROMETHAZINE HCL 25 MG/ML IJ SOLN
25.0000 mg | Freq: Four times a day (QID) | INTRAMUSCULAR | Status: DC | PRN
  Administered 2016-02-05 – 2016-02-07 (×7): 25 mg via INTRAVENOUS
  Filled 2016-02-05 (×8): qty 1

## 2016-02-05 MED ORDER — HYDROCODONE-ACETAMINOPHEN 5-325 MG PO TABS
1.0000 | ORAL_TABLET | Freq: Four times a day (QID) | ORAL | Status: DC | PRN
  Administered 2016-02-05 – 2016-02-07 (×5): 1 via ORAL
  Filled 2016-02-05 (×5): qty 1

## 2016-02-05 MED ORDER — ONDANSETRON HCL 4 MG/2ML IJ SOLN
4.0000 mg | Freq: Four times a day (QID) | INTRAMUSCULAR | Status: AC
  Administered 2016-02-05 – 2016-02-07 (×8): 4 mg via INTRAVENOUS
  Filled 2016-02-05 (×8): qty 2

## 2016-02-05 MED ORDER — METRONIDAZOLE 500 MG PO TABS
500.0000 mg | ORAL_TABLET | Freq: Three times a day (TID) | ORAL | Status: DC
  Administered 2016-02-05 – 2016-02-06 (×4): 500 mg via ORAL
  Filled 2016-02-05 (×4): qty 1

## 2016-02-05 MED ORDER — RISAQUAD PO CAPS
1.0000 | ORAL_CAPSULE | Freq: Two times a day (BID) | ORAL | Status: DC
  Administered 2016-02-05 – 2016-02-09 (×9): 1 via ORAL
  Filled 2016-02-05 (×9): qty 1

## 2016-02-05 MED ORDER — MORPHINE SULFATE (PF) 4 MG/ML IV SOLN
3.0000 mg | INTRAVENOUS | Status: DC | PRN
  Administered 2016-02-05 – 2016-02-06 (×5): 3 mg via INTRAVENOUS
  Filled 2016-02-05 (×6): qty 1

## 2016-02-05 MED ORDER — SODIUM CHLORIDE 0.9 % IV SOLN
INTRAVENOUS | Status: AC
  Administered 2016-02-05: 12:00:00 via INTRAVENOUS

## 2016-02-05 MED ORDER — IOPAMIDOL (ISOVUE-300) INJECTION 61%
100.0000 mL | Freq: Once | INTRAVENOUS | Status: AC | PRN
  Administered 2016-02-05: 100 mL via INTRAVENOUS

## 2016-02-05 NOTE — Care Management (Signed)
Abigail BondsGloria is still in a ton of pain and while responsive in conversation seems to be trying to conserve her energy and rest. The Chaplain did have a brief conversation with her and check with her to see how she is doing today, but she is not well and needs her rest. Therefore, the Chaplain will continue to check in periodically on her and pray with her when she desires.

## 2016-02-05 NOTE — Progress Notes (Signed)
Pt reports having multiple loose BM's this morning. PRN Morphine added for pain control.

## 2016-02-05 NOTE — Progress Notes (Signed)
[facility] [place] [facility]   PATIENT NAME: Abigail Kim    MR#:  [id number]  DATE OF BIRTH:  07/01/1970  SUBJECTIVE:  CHIEF COMPLAINT:   Chief Complaint  Patient presents with  . Diarrhea  . Abdominal Pain   - admitted with severe abdominal pain, diarrhea- c.diff positive - recent cholecystectomy as well - still has pain, nausea, vomiting, diarrhea  REVIEW OF SYSTEMS:  Review of Systems  Constitutional: Positive for malaise/fatigue. Negative for chills and fever.  HENT: Negative for ear discharge, ear pain and nosebleeds.   Eyes: Negative for blurred vision.  Respiratory: Negative for cough, shortness of breath and wheezing.   Cardiovascular: Negative for chest pain, palpitations and leg swelling.  Gastrointestinal: Positive for abdominal pain, diarrhea, nausea and vomiting. Negative for constipation.  Genitourinary: Negative for dysuria and urgency.  Musculoskeletal: Negative for myalgias.  Neurological: Negative for dizziness, sensory change, speech change, focal weakness, seizures and headaches.  Psychiatric/Behavioral: Negative for depression.    DRUG ALLERGIES:   Allergies  Allergen Reactions  . Amoxicillin Diarrhea and Nausea And Vomiting  . Bupropion Hcl Other (See Comments)    REACTION: hallucinations  . Duloxetine Other (See Comments)    REACTION: dizziness  . Metformin Diarrhea  . Venlafaxine Other (See Comments)    REACTION: hallucinations    VITALS:  Blood pressure 119/69, pulse 88, temperature 98.4 F (36.9 C), temperature source Oral, resp. rate 20, height 5\' 6"  (1.676 m), weight 63.1 kg (139 lb 1.6 oz), SpO2 98 %.  PHYSICAL EXAMINATION:  Physical Exam  GENERAL:  45 y.o.-year-old patient lying in the bed with no acute distress.  EYES: Pupils equal, round, reactive to light and accommodation. No scleral icterus. Extraocular muscles intact.  HEENT: Head atraumatic, normocephalic. Oropharynx and nasopharynx clear.    NECK:  Supple, no jugular venous distention. No thyroid enlargement, no tenderness.  LUNGS: Normal breath sounds bilaterally, no wheezing, rales,rhonchi or crepitation. No use of accessory muscles of respiration.  CARDIOVASCULAR: S1, S2 normal. No murmurs, rubs, or gallops.  ABDOMEN: Soft, tender in the epigastrium and supra umbilical regions, no guarding or rigidity, nondistended. Bowel sounds present. No organomegaly or mass.  EXTREMITIES: No pedal edema, cyanosis, or clubbing.  NEUROLOGIC: Cranial nerves II through XII are intact. Muscle strength 5/5 in all extremities. Sensation intact. Gait not checked.  PSYCHIATRIC: The patient is alert and oriented x 3.  SKIN: No obvious rash, lesion, or ulcer.    LABORATORY PANEL:   CBC  Recent Labs Lab 02/05/16 0657  WBC 30.8*  HGB 12.8  HCT 40.3  PLT 460*   ------------------------------------------------------------------------------------------------------------------  Chemistries   Recent Labs Lab 02/04/16 1723 02/05/16 0657  NA 139 137  K 4.4 4.6  CL 105 108  CO2 25 22  GLUCOSE 146* 139*  BUN 16 13  CREATININE 0.58 0.51  CALCIUM 8.9 8.2*  AST 21  --   ALT 20  --   ALKPHOS 52  --   BILITOT 0.7  --    ------------------------------------------------------------------------------------------------------------------  Cardiac Enzymes No results for input(s): TROPONINI in the last 168 hours. ------------------------------------------------------------------------------------------------------------------  RADIOLOGY:  No results found.  EKG:   Orders placed or performed during the hospital encounter of 02/04/16  . ED EKG  . ED EKG  . EKG 12-Lead  . EKG 12-Lead    ASSESSMENT AND PLAN:   45 year old female with past medical history significant for diabetes, Addison's disease, hypothyroidism, fibromyalgia who had recent cholecystectomy done last week presents to hospital  secondary to C. difficile colitis.  #1  sepsis-secondary to severe colitis, stool for C. difficile is positive. -Still has severe abdominal pain, nausea and vomiting and diarrhea. -Ordered CT of the abdomen. WBC is still elevated. Changed nausea medications-Zofran to scheduled and added Phenergan as needed. -Changed her diet to clear liquids. Appreciate GI consult -On Flagyl and vancomycin orally -Continue IV fluids and monitor closely. -Added probiotics as well. Recent antibiotic usage from gallbladder surgery could've triggered it. -Restart Questran  #2 hypertension-continue benazepril  #3 GERD-status post gastric bypass surgery. Continue Protonix  #4 diabetes mellitus-on sliding scale insulin.   All the records are reviewed and case discussed with Care Management/Social Workerr. Management plans discussed with the patient, family and they are in agreement.  CODE STATUS: Full Code  TOTAL TIME TAKING CARE OF THIS PATIENT: 38 minutes.   POSSIBLE D/C IN 1-2 DAYS, DEPENDING ON CLINICAL CONDITION.   Jameison Haji M.D on 02/05/2016 at 12:44 PM  Between 7am to 6pm - Pager - [phone]  After 6pm go to [email or web address] - Social research officer, governmentpassword EPAS [facility]  [facility]  Office  [phone]  CC: Primary care physician; Court JoyIMBROOK-DILLOW,KAREN M, PA-C

## 2016-02-05 NOTE — Plan of Care (Signed)
Problem: Bowel/Gastric: Goal: Will not experience complications related to bowel motility Outcome: Not Progressing Pt is still havin multiple loose BM's and Nausea.

## 2016-02-05 NOTE — Progress Notes (Signed)
Initial Nutrition Assessment  DOCUMENTATION CODES:   Not applicable  INTERVENTION:  Offered ONS, but pt declined, will provide as desired  NUTRITION DIAGNOSIS:   Inadequate oral intake related to acute illness, poor appetite as evidenced by per patient/family report.  GOAL:   Patient will meet greater than or equal to 90% of their needs  MONITOR:   PO intake, Labs, I & O's, Weight trends, Supplement acceptance  REASON FOR ASSESSMENT:   Malnutrition Screening Tool    ASSESSMENT:   Abigail Kim  is a 45 y.o. female who presents with Abdominal pain and diarrhea for the last 3-4 days. She recently had gallbladder surgery and was given antibiotics during the course of her hospital stay.  Spoke with [name], husband at bedside. Pt currently suffering from C Diff, ab pain, and diarrhea x4 days. Per her husband, she has had poor po intake for 2 weeks now, consumed little to nothing this morning. She states a normal wt of 147#, exhibits an 8#/5% severe wt loss over 2.5 months based on that. Nutrition-Focused physical exam completed. Findings are no fat depletion, no muscle depletion, and no edema. Offered her ONS but pt declined, is not feeling well, no appetite or desire to eat. Labs and medications reviewed: NS @ 1275mL/hr  Diet Order:  Diet clear liquid Room service appropriate? Yes; Fluid consistency: Thin  Skin:  Reviewed, no issues  Last BM:  02/05/2016  Height:   Ht Readings from Last 1 Encounters:  02/04/16 5\' 6"  (1.676 m)    Weight:   Wt Readings from Last 1 Encounters:  02/04/16 139 lb 1.6 oz (63.1 kg)    Ideal Body Weight:  59.09 kg  BMI:  Body mass index is 22.45 kg/m.  Estimated Nutritional Needs:   Kcal:  1600-1900 calories  Protein:  63-76 gm  Fluid:  >/= 1.6L  EDUCATION NEEDS:   No education needs identified at this time  Dionne AnoWilliam M. Sundae Maners, MS, RD LDN Inpatient Clinical Dietitian Pager [phone]

## 2016-02-05 NOTE — Consult Note (Signed)
Midge Minium, MD [facility]  [address], [address] [place], [place] [address] Phone: [phone] Fax : [phone]  Consultation  Referring Provider:     No ref. provider found Primary Care Physician:  Court Joy, PA-C Primary Gastroenterologist:  In [facility]         Reason for Consultation:     C. difficile colitis  Date of Admission:  02/04/2016 Date of Consultation:  02/05/2016         HPI:   Abigail Kim is a 45 y.o. female who was admitted with C. difficile colitis. The patient recently had her gallbladder out and was given antibiotics. The patient states that she started having diarrhea last week and then had a few days without diarrhea and then it came back with a vengeance. The patient reports severe abdominal pain at the present time. The patient was found to have an elevated white cell count on admission. The patient's white cell count on admission was 28.1 with a increased today to 30.8 despite being started on oral vancomycin. He should has a history of C. difficile colitis last year that was treated with antibiotics and she states the course was uneventful. In addition to the patient's abdominal pain and profuse diarrhea she states she is also feeling very tired and weak.  Past Medical History:  Diagnosis Date  . Addison's disease (HCC)   . Diabetes mellitus without complication (HCC)   . Fibromyalgia   . Hashimoto's disease   . Hashimoto's disease   . Hypercholesteremia   . Hypertension   . Major depression (HCC)   . Scoliosis   . Vitamin D deficiency     Past Surgical History:  Procedure Laterality Date  . CHOLECYSTECTOMY    . GASTRIC BYPASS  02/07/2015    Prior to Admission medications   Medication Sig Start Date End Date Taking? Authorizing Provider  albuterol (PROVENTIL HFA;VENTOLIN HFA) 108 (90 BASE) MCG/ACT inhaler Inhale into the lungs every 6 (six) hours as needed for wheezing or shortness of breath.   Yes Historical Provider, MD    benazepril (LOTENSIN) 10 MG tablet Take 10 mg by mouth daily.   Yes Historical Provider, MD  cholecalciferol (VITAMIN D) 1000 units tablet Take 1,000 Units by mouth daily.   Yes Historical Provider, MD  cholestyramine (QUESTRAN) 4 g packet Take 1 packet by mouth 3 (three) times daily.   Yes Historical Provider, MD  DULoxetine (CYMBALTA) 60 MG capsule Take 60 mg by mouth daily.   Yes Historical Provider, MD  gabapentin (NEURONTIN) 100 MG capsule Take 100-300 mg by mouth at bedtime as needed.   Yes Historical Provider, MD  magnesium oxide (MAG-OX) 400 MG tablet Take 400 mg by mouth daily.   Yes Historical Provider, MD  ondansetron (ZOFRAN-ODT) 8 MG disintegrating tablet Take 8 mg by mouth every 8 (eight) hours as needed for nausea/vomiting.   Yes Historical Provider, MD    Family History  Problem Relation Age of Onset  . Cirrhosis Brother   . Diabetes Father   . Heart disease Father   . Stroke Father      Social History  Substance Use Topics  . Smoking status: Former Games developer  . Smokeless tobacco: Never Used  . Alcohol use Yes     Comment: Occasional     Allergies as of 02/04/2016 - Review Complete 02/04/2016  Allergen Reaction Noted  . Amoxicillin Diarrhea and Nausea And Vomiting 09/25/2006  . Bupropion hcl Other (See Comments) 09/25/2006  . Duloxetine Other (See Comments)  09/25/2006  . Metformin Diarrhea 09/25/2006  . Venlafaxine Other (See Comments) 09/25/2006    Review of Systems:    All systems reviewed and negative except where noted in HPI.   Physical Exam:  Vital signs in last 24 hours: Temp:  [97.5 F (36.4 C)-98.7 F (37.1 C)] 98.4 F (36.9 C) (09/10 0424) Pulse Rate:  [72-102] 88 (09/10 1029) Resp:  [17-20] 20 (09/10 0424) BP: (102-138)/(68-98) 119/69 (09/10 1029) SpO2:  [98 %-100 %] 98 % (09/10 0424) Weight:  [137 lb (62.1 kg)-139 lb 1.6 oz (63.1 kg)] 139 lb 1.6 oz (63.1 kg) (09/09 2225) Last BM Date: 02/05/16 General:   Pleasant, cooperative in NAD Head:   Normocephalic and atraumatic. Eyes:   No icterus.   Conjunctiva pink. PERRLA. Ears:  Normal auditory acuity. Abdomen:  Soft, nondistended, Diffusely tender. Normal bowel sounds. No appreciable masses or hepatomegaly.  No rebound or guarding.  Rectal:  Not performed. Msk:  Symmetrical without gross deformities.  Extremities:  Without edema, cyanosis or clubbing. Neurologic:  Alert and oriented x3;  grossly normal neurologically. Skin:  Intact without significant lesions or rashes. Cervical Nodes:  No significant cervical adenopathy. Psych:  Alert and cooperative. Normal affect.  LAB RESULTS:  Recent Labs  02/04/16 1723 02/05/16 0657  WBC 28.1* 30.8*  HGB 15.4 12.8  HCT 48.2* 40.3  PLT 511* 460*   BMET  Recent Labs  02/04/16 1723 02/05/16 0657  NA 139 137  K 4.4 4.6  CL 105 108  CO2 25 22  GLUCOSE 146* 139*  BUN 16 13  CREATININE 0.58 0.51  CALCIUM 8.9 8.2*   LFT  Recent Labs  02/04/16 1723  PROT 7.0  ALBUMIN 3.8  AST 21  ALT 20  ALKPHOS 52  BILITOT 0.7   PT/INR No results for input(s): LABPROT, INR in the last 72 hours.  STUDIES: No results found.    Impression / Plan:   Abigail Kim is a 45 y.o. y/o female with C. difficile colitis after being treated with antibiotics for recent cholecystectomy. The patient has a history of having C. difficile colitis. Her ago which was treated uneventfully. She had been fine until this recent episode. Due to the high white cell count and slightly increasing white cell count since yesterday the patient will not only be treated with vancomycin as she is already prescribed up but will also add IV Flagyl. The patient and her family have been explained the plan and agree with it.   Thank you for involving me in the care of this patient.      LOS: 1 day   Midge Miniumarren Tyna Huertas, MD  02/05/2016, 11:14 AM   Note: This dictation was prepared with Dragon dictation along with smaller phrase technology. Any transcriptional errors  that result from this process are unintentional.

## 2016-02-06 LAB — GLUCOSE, CAPILLARY
GLUCOSE-CAPILLARY: 90 mg/dL (ref 65–99)
GLUCOSE-CAPILLARY: 101 mg/dL — AB (ref 65–99)
GLUCOSE-CAPILLARY: 87 mg/dL (ref 65–99)
Glucose-Capillary: 84 mg/dL (ref 65–99)

## 2016-02-06 LAB — CBC
HEMATOCRIT: 34.3 % — AB (ref 35.0–47.0)
Hemoglobin: 11.2 g/dL — ABNORMAL LOW (ref 12.0–16.0)
MCH: 22.4 pg — ABNORMAL LOW (ref 26.0–34.0)
MCHC: 32.7 g/dL (ref 32.0–36.0)
MCV: 68.5 fL — ABNORMAL LOW (ref 80.0–100.0)
Platelets: 464 10*3/uL — ABNORMAL HIGH (ref 150–440)
RBC: 5.01 MIL/uL (ref 3.80–5.20)
RDW: 15.9 % — AB (ref 11.5–14.5)
WBC: 21.2 10*3/uL — AB (ref 3.6–11.0)

## 2016-02-06 LAB — BASIC METABOLIC PANEL
ANION GAP: 3 — AB (ref 5–15)
BUN: 11 mg/dL (ref 6–20)
CALCIUM: 8.1 mg/dL — AB (ref 8.9–10.3)
CO2: 24 mmol/L (ref 22–32)
CREATININE: 0.54 mg/dL (ref 0.44–1.00)
Chloride: 109 mmol/L (ref 101–111)
Glucose, Bld: 101 mg/dL — ABNORMAL HIGH (ref 65–99)
Potassium: 3.9 mmol/L (ref 3.5–5.1)
SODIUM: 136 mmol/L (ref 135–145)

## 2016-02-06 MED ORDER — BENAZEPRIL HCL 20 MG PO TABS: 10.0000 mg | ORAL_TABLET | Freq: Every day | ORAL | Status: DC

## 2016-02-06 MED ORDER — METRONIDAZOLE IN NACL 5-0.79 MG/ML-% IV SOLN
500.0000 mg | Freq: Three times a day (TID) | INTRAVENOUS | Status: DC
  Administered 2016-02-06 – 2016-02-08 (×5): 500 mg via INTRAVENOUS
  Filled 2016-02-06 (×7): qty 100

## 2016-02-06 MED ORDER — SODIUM CHLORIDE 0.9 % IV SOLN
INTRAVENOUS | Status: DC
  Administered 2016-02-06 – 2016-02-09 (×5): via INTRAVENOUS

## 2016-02-06 MED ORDER — HYDROMORPHONE HCL 1 MG/ML IJ SOLN
2.0000 mg | INTRAMUSCULAR | Status: DC | PRN
Start: 2016-02-06 — End: 2016-02-09
  Administered 2016-02-06 – 2016-02-07 (×6): 2 mg via INTRAVENOUS
  Administered 2016-02-08: 1 mg via INTRAVENOUS
  Administered 2016-02-08 – 2016-02-09 (×2): 2 mg via INTRAVENOUS
  Filled 2016-02-06: qty 1
  Filled 2016-02-06: qty 2
  Filled 2016-02-06: qty 1
  Filled 2016-02-06 (×7): qty 2

## 2016-02-06 NOTE — Care Management Note (Signed)
Case Management Note  Patient Details  Name: Abigail Kim MRN: [id number] Date of Birth: April 12, 1971  Subjective/Objective:     45yo Abigail Kim was admitted with severe C-diff colitis. Today sipping clear liquids. Gallbladder removed 01/13/16. PCP=Karen Timbrook Dillow. [facility] [place]. Resides at home with husband. Drives self and husband can transport her to appointments if needed. Husband is currently at bedside and appears to be very supportive. Abigail Kim denies having any home assistive equipment, no home oxygen, and no home health services. Case management will follow for discharge planning but do not anticipate any home needs.               Action/Plan   Expected Discharge Date:                  Expected Discharge Plan:     In-House Referral:     Discharge planning Services     Post Acute Care Choice:    Choice offered to:     DME Arranged:    DME Agency:     HH Arranged:    HH Agency:     Status of Service:     If discussed at [employer] Length of Stay Meetings, dates discussed:    Additional Comments:  [name] A, RN 02/06/2016, 1:32 PM

## 2016-02-06 NOTE — Progress Notes (Signed)
[employer] - [place] at [facility]   PATIENT NAME: Abigail Kim    MR#:  [id number]  DATE OF BIRTH:  [birth year]  SUBJECTIVE:  CHIEF COMPLAINT:   Chief Complaint  Patient presents with  . Diarrhea  . Abdominal Pain   - Continues to have diffuse abdominal pain, worsening diarrhea and also nausea. -Unable to take anything by mouth. Still having a lot of watery stools.  REVIEW OF SYSTEMS:  Review of Systems  Constitutional: Positive for malaise/fatigue. Negative for chills and fever.  HENT: Negative for ear discharge, ear pain and nosebleeds.   Eyes: Negative for blurred vision.  Respiratory: Negative for cough, shortness of breath and wheezing.   Cardiovascular: Negative for chest pain, palpitations and leg swelling.  Gastrointestinal: Positive for abdominal pain, diarrhea, nausea and vomiting. Negative for constipation.  Genitourinary: Negative for dysuria and urgency.  Musculoskeletal: Negative for myalgias.  Neurological: Negative for dizziness, sensory change, speech change, focal weakness, seizures and headaches.  Psychiatric/Behavioral: Negative for depression.    DRUG ALLERGIES:   Allergies  Allergen Reactions  . Amoxicillin Diarrhea and Nausea And Vomiting  . Bupropion Hcl Other (See Comments)    REACTION: hallucinations  . Duloxetine Other (See Comments)    REACTION: dizziness  . Metformin Diarrhea  . Venlafaxine Other (See Comments)    REACTION: hallucinations    VITALS:  Blood pressure 109/65, pulse 90, temperature 98.4 F (36.9 C), temperature source Oral, resp. rate 18, height 5\' 6"  (1.676 m), weight 63.1 kg (139 lb 1.6 oz), SpO2 100 %.  PHYSICAL EXAMINATION:  Physical Exam  GENERAL:  45 y.o.-year-old patient lying in the bed with no acute distress. Feels miserable. EYES: Pupils equal, round, reactive to light and accommodation. No scleral icterus. Extraocular muscles intact.  HEENT: Head atraumatic, normocephalic. Oropharynx and  nasopharynx clear.  NECK:  Supple, no jugular venous distention. No thyroid enlargement, no tenderness.  LUNGS: Normal breath sounds bilaterally, no wheezing, rales,rhonchi or crepitation. No use of accessory muscles of respiration.  CARDIOVASCULAR: S1, S2 normal. No murmurs, rubs, or gallops.  ABDOMEN: Soft, tender in the epigastrium and supra umbilical regions, no guarding or rigidity, nondistended. Bowel sounds present. No organomegaly or mass.  EXTREMITIES: No pedal edema, cyanosis, or clubbing.  NEUROLOGIC: Cranial nerves II through XII are intact. Muscle strength 5/5 in all extremities. Sensation intact. Gait not checked.  PSYCHIATRIC: The patient is alert and oriented x 3.  SKIN: No obvious rash, lesion, or ulcer.    LABORATORY PANEL:   CBC  Recent Labs Lab 02/06/16 0526  WBC 21.2*  HGB 11.2*  HCT 34.3*  PLT 464*   ------------------------------------------------------------------------------------------------------------------  Chemistries   Recent Labs Lab 02/04/16 1723  02/06/16 0526  NA 139  < > 136  K 4.4  < > 3.9  CL 105  < > 109  CO2 25  < > 24  GLUCOSE 146*  < > 101*  BUN 16  < > 11  CREATININE 0.58  < > 0.54  CALCIUM 8.9  < > 8.1*  AST 21  --   --   ALT 20  --   --   ALKPHOS 52  --   --   BILITOT 0.7  --   --   < > = values in this interval not displayed. ------------------------------------------------------------------------------------------------------------------  Cardiac Enzymes No results for input(s): TROPONINI in the last 168 hours. ------------------------------------------------------------------------------------------------------------------  RADIOLOGY:  Ct Abdomen Pelvis W Contrast  Result Date: 02/05/2016 CLINICAL DATA:  Severe abdominal pain,  diarrhea EXAM: CT ABDOMEN AND PELVIS WITH CONTRAST TECHNIQUE: Multidetector CT imaging of the abdomen and pelvis was performed using the standard protocol following bolus administration of  intravenous contrast. CONTRAST:  100mL ISOVUE-300 IOPAMIDOL (ISOVUE-300) INJECTION 61% COMPARISON:  CT scan 08/22/2011 FINDINGS: Lower chest: The lung bases are unremarkable.  Small hiatal hernia. Hepatobiliary: Enhanced liver is unremarkable. Status postcholecystectomy. Pancreas: Enhanced pancreas is unremarkable. Spleen: Normal in size without focal abnormality. Adrenals/Urinary Tract: No adrenal gland mass. No hydronephrosis or hydroureter. Delayed renal images shows bilateral renal symmetrical excretion. Bilateral visualized proximal ureter is unremarkable. Stomach/Bowel: No small bowel obstruction. There is diffuse thickening of colonic wall and enhancement of the mucosa in right colon transverse colon descending colon and rectosigmoid colon. Findings are consistent with diffuse colitis. Clostridium difficile colitis is a clinical consideration. No pericolonic abscess or perforation. No colonic obstruction. Status post gastric bypass surgery. Vascular/Lymphatic: No retroperitoneal or mesenteric adenopathy. No aortic aneurysm. Reproductive: The uterus is anteflexed. No adnexal mass. Minimal congested left adnexal vessels. Other: No ascites or free abdominal air. There is mild deep tanning of the skin in umbilical region. Mild cellulitis cannot be excluded. Musculoskeletal: No destructive bony lesions are noted. Sagittal images of the spine shows mild degenerative changes thoracolumbar spine. IMPRESSION: 1. There is diffuse thickening of colonic wall and enhancement of the mucosa consistent with diffuse colitis. No pericolonic abscess or perforation is noted. 2. No small bowel obstruction. 3. No hydronephrosis or hydroureter. 4. Status post gastric bypass surgery. 5. Minimal congested left adnexal vessels.  No adnexal mass. Electronically Signed   By: Natasha MeadLiviu  Pop M.D.   On: 02/05/2016 16:11    EKG:   Orders placed or performed during the hospital encounter of 02/04/16  . ED EKG  . ED EKG  . EKG 12-Lead  .  EKG 12-Lead    ASSESSMENT AND PLAN:   45 year old female with past medical history significant for diabetes, Addison's disease, hypothyroidism, fibromyalgia who had recent cholecystectomy done last week presents to hospital secondary to C. difficile colitis.  #1 sepsis-secondary to severe colitis, stool for C. difficile is positive. - CT of the abd with severe diffuse colitis- still has significant abdominal pain, diarrhea and nausea - nausea medications-Zofran to scheduled and added Phenergan as needed. -on clear liquids. Appreciate GI consult -On Flagyl and vancomycin orally -Continue IV fluids and monitor closely. -Added probiotics as well. Recent antibiotic usage from gallbladder surgery could've triggered it. -Restarted Questran -WBC is improving. Pain medications adjusted  #2 hypertension-continue benazepril  #3 GERD-status post gastric bypass surgery. Continue Protonix  #4 diabetes mellitus-on sliding scale insulin.  Husband updated at bedside Not much changes today. We'll continue to monitor.   All the records are reviewed and case discussed with Care Management/Social Workerr. Management plans discussed with the patient, family and they are in agreement.  CODE STATUS: Full Code  TOTAL TIME TAKING CARE OF THIS PATIENT: 38 minutes.   POSSIBLE D/C IN 2 DAYS, DEPENDING ON CLINICAL CONDITION.   Enid BaasKALISETTI,Emmarae Cowdery M.D on 02/06/2016 at 11:49 AM  Between 7am to 6pm - Pager - [phone]  After 6pm go to [email or web address] - Social research officer, governmentpassword EPAS [facility]  [facility] [place] [facility]  Office  [phone]  CC: Primary care physician; Court JoyIMBROOK-DILLOW,KAREN M, PA-C

## 2016-02-07 LAB — BASIC METABOLIC PANEL
ANION GAP: 4 — AB (ref 5–15)
BUN: 8 mg/dL (ref 6–20)
CO2: 23 mmol/L (ref 22–32)
Calcium: 7.8 mg/dL — ABNORMAL LOW (ref 8.9–10.3)
Chloride: 110 mmol/L (ref 101–111)
Creatinine, Ser: 0.47 mg/dL (ref 0.44–1.00)
GFR calc Af Amer: >60 mL/min (ref >60)
GLUCOSE: 84 mg/dL (ref 65–99)
POTASSIUM: 3.4 mmol/L — AB (ref 3.5–5.1)
Sodium: 137 mmol/L (ref 135–145)

## 2016-02-07 LAB — CBC
HEMATOCRIT: 30.7 % — AB (ref 35.0–47.0)
Hemoglobin: 10.1 g/dL — ABNORMAL LOW (ref 12.0–16.0)
MCH: 22.9 pg — ABNORMAL LOW (ref 26.0–34.0)
MCHC: 32.8 g/dL (ref 32.0–36.0)
MCV: 69.7 fL — AB (ref 80.0–100.0)
Platelets: 413 10*3/uL (ref 150–440)
RBC: 4.4 MIL/uL (ref 3.80–5.20)
RDW: 15.8 % — ABNORMAL HIGH (ref 11.5–14.5)
WBC: 15 10*3/uL — AB (ref 3.6–11.0)

## 2016-02-07 LAB — GLUCOSE, CAPILLARY
GLUCOSE-CAPILLARY: 78 mg/dL (ref 65–99)
GLUCOSE-CAPILLARY: 71 mg/dL (ref 65–99)
Glucose-Capillary: 76 mg/dL (ref 65–99)
Glucose-Capillary: 73 mg/dL (ref 65–99)

## 2016-02-07 MED ORDER — POTASSIUM CHLORIDE 10 MEQ/100ML IV SOLN
10.0000 meq | INTRAVENOUS | Status: AC
  Administered 2016-02-07 (×4): 10 meq via INTRAVENOUS
  Filled 2016-02-07 (×4): qty 100

## 2016-02-07 MED ORDER — INFLUENZA VAC SPLIT QUAD 0.5 ML IM SUSY: 0.5000 mL | PREFILLED_SYRINGE | INTRAMUSCULAR | Status: DC | PRN

## 2016-02-07 MED ORDER — PROMETHAZINE HCL 25 MG PO TABS
25.0000 mg | ORAL_TABLET | Freq: Four times a day (QID) | ORAL | Status: DC | PRN
  Administered 2016-02-08 – 2016-02-09 (×5): 25 mg via ORAL
  Filled 2016-02-07 (×5): qty 1

## 2016-02-07 MED ORDER — ONDANSETRON HCL 4 MG/2ML IJ SOLN
4.0000 mg | Freq: Four times a day (QID) | INTRAMUSCULAR | Status: AC
  Administered 2016-02-07 – 2016-02-09 (×8): 4 mg via INTRAVENOUS
  Filled 2016-02-07 (×8): qty 2

## 2016-02-07 MED ORDER — HYDROCODONE-ACETAMINOPHEN 5-325 MG PO TABS
1.0000 | ORAL_TABLET | ORAL | Status: DC | PRN
Start: 2016-02-07 — End: 2016-02-09
  Administered 2016-02-07 – 2016-02-09 (×7): 1 via ORAL
  Filled 2016-02-07 (×7): qty 1

## 2016-02-07 NOTE — Plan of Care (Signed)
Problem: Nutrition: Goal: Adequate nutrition will be maintained Outcome: Progressing Pt was able to tolerate some PO intake.

## 2016-02-07 NOTE — Progress Notes (Signed)
Abigail Minium, MD [facility]   [address], [address] [place], [place] [address] Phone: [phone] Fax : [phone]   Subjective: The patient reports that her diarrhea has improved but she continues to have abdominal pain. The patient's white cell count has significantly improved from admission.   Objective: Vital signs in last 24 hours: Vitals:   02/06/16 1259 02/06/16 2121 02/07/16 0501 02/07/16 1303  BP: 110/68 (!) 94/55 (!) 92/51 (!) 98/57  Pulse: 86 78 83 86  Resp: 18 17 18 18   Temp: 98.4 F (36.9 C) 98.2 F (36.8 C) 98.3 F (36.8 C) 98.4 F (36.9 C)  TempSrc: Oral Oral Oral Oral  SpO2: 100% 98% 96% 98%  Weight:      Height:       Weight change:   Intake/Output Summary (Last 24 hours) at 02/07/16 1313 Last data filed at 02/07/16 1000  Gross per 24 hour  Intake          1439.26 ml  Output                0 ml  Net          1439.26 ml     Exam: Heart:: Regular rate and rhythm Lungs: normal and clear to auscultation Abdomen: soft, diffuse tenderness, normal bowel sounds   Lab Results: @LABTEST2 @ Micro Results: Recent Results (from the past 240 hour(s))  Gastrointestinal Panel by PCR , Stool     Status: None   Collection Time: 02/04/16  7:00 PM  Result Value Ref Range Status   Campylobacter species NOT DETECTED NOT DETECTED Final   Plesimonas shigelloides NOT DETECTED NOT DETECTED Final   Salmonella species NOT DETECTED NOT DETECTED Final   Yersinia enterocolitica NOT DETECTED NOT DETECTED Final   Vibrio species NOT DETECTED NOT DETECTED Final   Vibrio cholerae NOT DETECTED NOT DETECTED Final   Enteroaggregative E coli (EAEC) NOT DETECTED NOT DETECTED Final   Enteropathogenic E coli (EPEC) NOT DETECTED NOT DETECTED Final   Enterotoxigenic E coli (ETEC) NOT DETECTED NOT DETECTED Final   Shiga like toxin producing E coli (STEC) NOT DETECTED NOT DETECTED Final   Shigella/Enteroinvasive E coli (EIEC) NOT DETECTED NOT DETECTED Final   Cryptosporidium NOT  DETECTED NOT DETECTED Final   Cyclospora cayetanensis NOT DETECTED NOT DETECTED Final   Entamoeba histolytica NOT DETECTED NOT DETECTED Final   Giardia lamblia NOT DETECTED NOT DETECTED Final   Adenovirus F40/41 NOT DETECTED NOT DETECTED Final   Astrovirus NOT DETECTED NOT DETECTED Final   Norovirus GI/GII NOT DETECTED NOT DETECTED Final   Rotavirus A NOT DETECTED NOT DETECTED Final   Sapovirus (I, II, IV, and V) NOT DETECTED NOT DETECTED Final  C difficile quick scan w PCR reflex     Status: Abnormal   Collection Time: 02/04/16  7:00 PM  Result Value Ref Range Status   C Diff antigen POSITIVE (A) NEGATIVE Final   C Diff toxin POSITIVE (A) NEGATIVE Final   C Diff interpretation Toxin producing C. difficile detected.  Final    Comment: CRITICAL RESULT CALLED TO, READ BACK BY AND VERIFIED WITH: [facility] LOEJAR 02/04/16 AT 2009 BY HS    Studies/Results: Ct Abdomen Pelvis W Contrast  Result Date: 02/05/2016 CLINICAL DATA:  Severe abdominal pain, diarrhea EXAM: CT ABDOMEN AND PELVIS WITH CONTRAST TECHNIQUE: Multidetector CT imaging of the abdomen and pelvis was performed using the standard protocol following bolus administration of intravenous contrast. CONTRAST:  ISOVUE-300 IOPAMIDOL (ISOVUE-300) INJECTION 61% COMPARISON:  CT scan 08/22/2011  FINDINGS: Lower chest: The lung bases are unremarkable.  Small hiatal hernia. Hepatobiliary: Enhanced liver is unremarkable. Status postcholecystectomy. Pancreas: Enhanced pancreas is unremarkable. Spleen: Normal in size without focal abnormality. Adrenals/Urinary Tract: No adrenal gland mass. No hydronephrosis or hydroureter. Delayed renal images shows bilateral renal symmetrical excretion. Bilateral visualized proximal ureter is unremarkable. Stomach/Bowel: No small bowel obstruction. There is diffuse thickening of colonic wall and enhancement of the mucosa in right colon transverse colon descending colon and rectosigmoid colon. Findings are consistent with  diffuse colitis. Clostridium difficile colitis is a clinical consideration. No pericolonic abscess or perforation. No colonic obstruction. Status post gastric bypass surgery. Vascular/Lymphatic: No retroperitoneal or mesenteric adenopathy. No aortic aneurysm. Reproductive: The uterus is anteflexed. No adnexal mass. Minimal congested left adnexal vessels. Other: No ascites or free abdominal air. There is mild deep tanning of the skin in umbilical region. Mild cellulitis cannot be excluded. Musculoskeletal: No destructive bony lesions are noted. Sagittal images of the spine shows mild degenerative changes thoracolumbar spine. IMPRESSION: 1. There is diffuse thickening of colonic wall and enhancement of the mucosa consistent with diffuse colitis. No pericolonic abscess or perforation is noted. 2. No small bowel obstruction. 3. No hydronephrosis or hydroureter. 4. Status post gastric bypass surgery. 5. Minimal congested left adnexal vessels.  No adnexal mass. Electronically Signed   By: Natasha MeadLiviu  Pop M.D.   On: 02/05/2016 16:11   Medications: I have reviewed the patient's current medications. Scheduled Meds: . acidophilus  1 capsule Oral BID  . cholestyramine  4 g Oral TID  . enoxaparin (LOVENOX) injection  40 mg Subcutaneous Q24H  . insulin aspart  0-5 Units Subcutaneous QHS  . insulin aspart  0-9 Units Subcutaneous TID WC  . metronidazole  500 mg Intravenous Q8H  . ondansetron (ZOFRAN) IV  4 mg Intravenous Q6H  . pantoprazole  40 mg Oral Daily  . potassium chloride  10 mEq Intravenous Q1 Hr x 4  . sodium chloride flush  3 mL Intravenous Q12H  . vancomycin  125 mg Oral QID   Continuous Infusions: . sodium chloride 75 mL/hr at 02/07/16 0910   PRN Meds:.acetaminophen **OR** acetaminophen, HYDROcodone-acetaminophen, HYDROmorphone (DILAUDID) injection, promethazine   Assessment: Principal Problem:   C. difficile colitis Active Problems:   Diabetes (HCC)   HLD (hyperlipidemia)   Anxiety   Essential  hypertension   GERD (gastroesophageal reflux disease)    Plan: This patient has C. difficile colitis with a good response to her Flagyl and vancomycin. Both of these should be continued for at least 10-14 days after discharge due to her severe case of C. difficile colitis. Nothing further to do from a GI point of view and I will follow from afar for now. If there is any further concerns please do not hesitate to call.   LOS: 3 days   Abigail MiniumDarren Meilah Delrosario 02/07/2016, 1:13 PM

## 2016-02-07 NOTE — Progress Notes (Signed)
[employer] - [place] at [facility]   PATIENT NAME: Abigail Kim    MR#:  [id number]  DATE OF BIRTH:  [birth year]  SUBJECTIVE:  CHIEF COMPLAINT:   Chief Complaint  Patient presents with  . Diarrhea  . Abdominal Pain   - minimal improvement in nausea today, still has diffuse abdominal pain - watery stools, less frequent last night  REVIEW OF SYSTEMS:  Review of Systems  Constitutional: Positive for malaise/fatigue. Negative for chills and fever.  HENT: Negative for ear discharge, ear pain and nosebleeds.   Eyes: Negative for blurred vision.  Respiratory: Negative for cough, shortness of breath and wheezing.   Cardiovascular: Negative for chest pain, palpitations and leg swelling.  Gastrointestinal: Positive for abdominal pain, diarrhea and nausea. Negative for constipation and vomiting.  Genitourinary: Negative for dysuria and urgency.  Musculoskeletal: Negative for myalgias.  Neurological: Negative for dizziness, sensory change, speech change, focal weakness, seizures and headaches.  Psychiatric/Behavioral: Negative for depression.    DRUG ALLERGIES:   Allergies  Allergen Reactions  . Amoxicillin Diarrhea and Nausea And Vomiting  . Bupropion Hcl Other (See Comments)    REACTION: hallucinations  . Duloxetine Other (See Comments)    REACTION: dizziness  . Metformin Diarrhea  . Venlafaxine Other (See Comments)    REACTION: hallucinations    VITALS:  Blood pressure (!) 92/51, pulse 83, temperature 98.3 F (36.8 C), temperature source Oral, resp. rate 18, height 5\' 6"  (1.676 m), weight 63.1 kg (139 lb 1.6 oz), SpO2 96 %.  PHYSICAL EXAMINATION:  Physical Exam  GENERAL:  45 y.o.-year-old patient lying in the bed with no acute distress. Feels miserable. EYES: Pupils equal, round, reactive to light and accommodation. No scleral icterus. Extraocular muscles intact.  HEENT: Head atraumatic, normocephalic. Oropharynx and nasopharynx clear.  NECK:   Supple, no jugular venous distention. No thyroid enlargement, no tenderness.  LUNGS: Normal breath sounds bilaterally, no wheezing, rales,rhonchi or crepitation. No use of accessory muscles of respiration.  CARDIOVASCULAR: S1, S2 normal. No murmurs, rubs, or gallops.  ABDOMEN: Soft, diffuse tenderness in the lower abdomen, no guarding or rigidity, nondistended. Bowel sounds present. No organomegaly or mass.  EXTREMITIES: No pedal edema, cyanosis, or clubbing.  NEUROLOGIC: Cranial nerves II through XII are intact. Muscle strength 5/5 in all extremities. Sensation intact. Gait not checked.  PSYCHIATRIC: The patient is alert and oriented x 3.  SKIN: No obvious rash, lesion, or ulcer.    LABORATORY PANEL:   CBC  Recent Labs Lab 02/07/16 0518  WBC 15.0*  HGB 10.1*  HCT 30.7*  PLT 413   ------------------------------------------------------------------------------------------------------------------  Chemistries   Recent Labs Lab 02/04/16 1723  02/07/16 0518  NA 139  < > 137  K 4.4  < > 3.4*  CL 105  < > 110  CO2 25  < > 23  GLUCOSE 146*  < > 84  BUN 16  < > 8  CREATININE 0.58  < > 0.47  CALCIUM 8.9  < > 7.8*  AST 21  --   --   ALT 20  --   --   ALKPHOS 52  --   --   BILITOT 0.7  --   --   < > = values in this interval not displayed. ------------------------------------------------------------------------------------------------------------------  Cardiac Enzymes No results for input(s): TROPONINI in the last 168 hours. ------------------------------------------------------------------------------------------------------------------  RADIOLOGY:  Ct Abdomen Pelvis W Contrast  Result Date: 02/05/2016 CLINICAL DATA:  Severe abdominal pain, diarrhea EXAM: CT ABDOMEN AND PELVIS WITH  CONTRAST TECHNIQUE: Multidetector CT imaging of the abdomen and pelvis was performed using the standard protocol following bolus administration of intravenous contrast. CONTRAST:  100mL ISOVUE-300  IOPAMIDOL (ISOVUE-300) INJECTION 61% COMPARISON:  CT scan 08/22/2011 FINDINGS: Lower chest: The lung bases are unremarkable.  Small hiatal hernia. Hepatobiliary: Enhanced liver is unremarkable. Status postcholecystectomy. Pancreas: Enhanced pancreas is unremarkable. Spleen: Normal in size without focal abnormality. Adrenals/Urinary Tract: No adrenal gland mass. No hydronephrosis or hydroureter. Delayed renal images shows bilateral renal symmetrical excretion. Bilateral visualized proximal ureter is unremarkable. Stomach/Bowel: No small bowel obstruction. There is diffuse thickening of colonic wall and enhancement of the mucosa in right colon transverse colon descending colon and rectosigmoid colon. Findings are consistent with diffuse colitis. Clostridium difficile colitis is a clinical consideration. No pericolonic abscess or perforation. No colonic obstruction. Status post gastric bypass surgery. Vascular/Lymphatic: No retroperitoneal or mesenteric adenopathy. No aortic aneurysm. Reproductive: The uterus is anteflexed. No adnexal mass. Minimal congested left adnexal vessels. Other: No ascites or free abdominal air. There is mild deep tanning of the skin in umbilical region. Mild cellulitis cannot be excluded. Musculoskeletal: No destructive bony lesions are noted. Sagittal images of the spine shows mild degenerative changes thoracolumbar spine. IMPRESSION: 1. There is diffuse thickening of colonic wall and enhancement of the mucosa consistent with diffuse colitis. No pericolonic abscess or perforation is noted. 2. No small bowel obstruction. 3. No hydronephrosis or hydroureter. 4. Status post gastric bypass surgery. 5. Minimal congested left adnexal vessels.  No adnexal mass. Electronically Signed   By: Natasha MeadLiviu  Pop M.D.   On: 02/05/2016 16:11    EKG:   Orders placed or performed during the hospital encounter of 02/04/16  . ED EKG  . ED EKG  . EKG 12-Lead  . EKG 12-Lead    ASSESSMENT AND PLAN:    45 year old female with past medical history significant for diabetes, Addison's disease, hypothyroidism, fibromyalgia who had recent cholecystectomy done last week presents to hospital secondary to C. difficile colitis.  #1 sepsis-secondary to severe c.diff colitis. - CT of the abd with severe diffuse colitis- still has significant abdominal pain, diarrhea and nausea - nausea medications-Zofran to scheduled and added Phenergan as needed. - advance to full liquids today. Appreciate GI consult -On Flagyl and vancomycin orally -Continue IV fluids and monitor closely. -Added probiotics as well. Recent antibiotic usage from gallbladder surgery could've triggered it. -Restarted Questran -WBC is improving. Pain medications adjusted  #2 hypertension-hold benazepril as BP low  #3 GERD-status post gastric bypass surgery. Continue Protonix  #4 diabetes mellitus-on sliding scale insulin.  #5 Hypokalemia- being replaced  #6 DVT Prophylaxis-  lovenox    All the records are reviewed and case discussed with Care Management/Social Workerr. Management plans discussed with the patient, family and they are in agreement.  CODE STATUS: Full Code  TOTAL TIME TAKING CARE OF THIS PATIENT: 38 minutes.   POSSIBLE D/C IN 1-2 DAYS, DEPENDING ON CLINICAL CONDITION.   Enid BaasKALISETTI,Benigna Delisi M.D on 02/07/2016 at 8:11 AM  Between 7am to 6pm - Pager - [phone]  After 6pm go to [email or web address] - Social research officer, governmentpassword EPAS [facility]  [facility] [place] [facility]  Office  [phone]  CC: Primary care physician; Court JoyIMBROOK-DILLOW,KAREN M, PA-C

## 2016-02-08 LAB — CBC
HEMATOCRIT: 30.1 % — AB (ref 35.0–47.0)
HEMOGLOBIN: 9.9 g/dL — AB (ref 12.0–16.0)
MCH: 22.7 pg — ABNORMAL LOW (ref 26.0–34.0)
MCHC: 33 g/dL (ref 32.0–36.0)
MCV: 68.9 fL — ABNORMAL LOW (ref 80.0–100.0)
Platelets: 398 10*3/uL (ref 150–440)
RBC: 4.36 MIL/uL (ref 3.80–5.20)
RDW: 16 % — AB (ref 11.5–14.5)
WBC: 10.7 10*3/uL (ref 3.6–11.0)

## 2016-02-08 LAB — GLUCOSE, CAPILLARY
GLUCOSE-CAPILLARY: 68 mg/dL (ref 65–99)
GLUCOSE-CAPILLARY: 105 mg/dL — AB (ref 65–99)
Glucose-Capillary: 71 mg/dL (ref 65–99)
Glucose-Capillary: 95 mg/dL (ref 65–99)
Glucose-Capillary: 86 mg/dL (ref 65–99)

## 2016-02-08 LAB — BASIC METABOLIC PANEL
ANION GAP: 4 — AB (ref 5–15)
BUN: <5 mg/dL — ABNORMAL LOW (ref 6–20)
CALCIUM: 7.5 mg/dL — AB (ref 8.9–10.3)
CO2: 24 mmol/L (ref 22–32)
Chloride: 108 mmol/L (ref 101–111)
Creatinine, Ser: 0.43 mg/dL — ABNORMAL LOW (ref 0.44–1.00)
Glucose, Bld: 90 mg/dL (ref 65–99)
Potassium: 3.3 mmol/L — ABNORMAL LOW (ref 3.5–5.1)
SODIUM: 136 mmol/L (ref 135–145)

## 2016-02-08 MED ORDER — DULOXETINE HCL 60 MG PO CPEP
60.0000 mg | ORAL_CAPSULE | Freq: Every day | ORAL | Status: DC
  Administered 2016-02-08 – 2016-02-09 (×2): 60 mg via ORAL
  Filled 2016-02-08 (×2): qty 1

## 2016-02-08 MED ORDER — POTASSIUM CHLORIDE 10 MEQ/100ML IV SOLN
10.0000 meq | INTRAVENOUS | Status: AC
  Administered 2016-02-08 (×3): 10 meq via INTRAVENOUS
  Filled 2016-02-08 (×4): qty 100

## 2016-02-08 MED ORDER — METRONIDAZOLE 500 MG PO TABS
500.0000 mg | ORAL_TABLET | Freq: Three times a day (TID) | ORAL | Status: DC
  Administered 2016-02-08 – 2016-02-09 (×4): 500 mg via ORAL
  Filled 2016-02-08 (×4): qty 1

## 2016-02-08 MED ORDER — DICYCLOMINE HCL 10 MG PO CAPS
10.0000 mg | ORAL_CAPSULE | Freq: Three times a day (TID) | ORAL | Status: DC
  Administered 2016-02-08 – 2016-02-09 (×3): 10 mg via ORAL
  Filled 2016-02-08 (×3): qty 1

## 2016-02-08 MED ORDER — POTASSIUM CHLORIDE 10 MEQ/100ML IV SOLN
10.0000 meq | Freq: Once | INTRAVENOUS | Status: AC
  Administered 2016-02-08: 10 meq via INTRAVENOUS

## 2016-02-08 NOTE — Progress Notes (Signed)
CONCERNING: Antibiotic IV to Oral Route Change Policy  RECOMMENDATION: This patient is receiving metronidazole by the intravenous route.  Based on criteria approved by the Pharmacy and Therapeutics Committee, the antibiotic(s) is/are being converted to the equivalent oral dose form(s).   DESCRIPTION: These criteria include:  Patient being treated for a respiratory tract infection, urinary tract infection, cellulitis or clostridium difficile associated diarrhea if on metronidazole  The patient is not neutropenic and does not exhibit a GI malabsorption state  The patient is eating (either orally or via tube) and/or has been taking other orally administered medications for a least 24 hours  The patient is improving clinically and has a Tmax < 100.5  If you have questions about this conversion, please contact the Pharmacy Department  []  ( [phone] )  [place] [x]  ( [phone] )  [facility] []  ( [phone]  [place] []  ( [phone] )  [facility] []  ( [phone] )  [facility]   

## 2016-02-08 NOTE — Progress Notes (Signed)
[facility] [place] at [facility]   PATIENT NAME: Abigail Kim    MR#:  [id number]  DATE OF BIRTH:  Jun 20, 1970  SUBJECTIVE:  CHIEF COMPLAINT:   Chief Complaint  Patient presents with  . Diarrhea  . Abdominal Pain   - slowly improving, appears stronger today- started on a soft diet - still has nausea and abdominal pain  REVIEW OF SYSTEMS:  Review of Systems  Constitutional: Positive for malaise/fatigue. Negative for chills and fever.  HENT: Negative for ear discharge, ear pain and nosebleeds.   Eyes: Negative for blurred vision.  Respiratory: Negative for cough, shortness of breath and wheezing.   Cardiovascular: Negative for chest pain, palpitations and leg swelling.  Gastrointestinal: Positive for abdominal pain, diarrhea and nausea. Negative for constipation and vomiting.  Genitourinary: Negative for dysuria and urgency.  Musculoskeletal: Negative for myalgias.  Neurological: Negative for dizziness, sensory change, speech change, focal weakness, seizures and headaches.  Psychiatric/Behavioral: Negative for depression.    DRUG ALLERGIES:   Allergies  Allergen Reactions  . Amoxicillin Diarrhea and Nausea And Vomiting  . Bupropion Hcl Other (See Comments)    REACTION: hallucinations  . Duloxetine Other (See Comments)    REACTION: dizziness  . Metformin Diarrhea  . Venlafaxine Other (See Comments)    REACTION: hallucinations    VITALS:  Blood pressure (!) 95/50, pulse 75, temperature 98.4 F (36.9 C), temperature source Oral, resp. rate 17, height 5\' 6"  (1.676 m), weight 63.1 kg (139 lb 1.6 oz), SpO2 97 %.  PHYSICAL EXAMINATION:  Physical Exam  GENERAL:  45 y.o.-year-old patient lying in the bed with no acute distress. Feels miserable. EYES: Pupils equal, round, reactive to light and accommodation. No scleral icterus. Extraocular muscles intact.  HEENT: Head atraumatic, normocephalic. Oropharynx and nasopharynx clear.  NECK:  Supple, no  jugular venous distention. No thyroid enlargement, no tenderness.  LUNGS: Normal breath sounds bilaterally, no wheezing, rales,rhonchi or crepitation. No use of accessory muscles of respiration.  CARDIOVASCULAR: S1, S2 normal. No murmurs, rubs, or gallops.  ABDOMEN: Soft, diffuse tenderness in the lower abdomen, no guarding or rigidity, nondistended. Bowel sounds present. No organomegaly or mass.  EXTREMITIES: No pedal edema, cyanosis, or clubbing.  NEUROLOGIC: Cranial nerves II through XII are intact. Muscle strength 5/5 in all extremities. Sensation intact. Gait not checked.  PSYCHIATRIC: The patient is alert and oriented x 3.  SKIN: No obvious rash, lesion, or ulcer.    LABORATORY PANEL:   CBC  Recent Labs Lab 02/08/16 0452  WBC 10.7  HGB 9.9*  HCT 30.1*  PLT 398   ------------------------------------------------------------------------------------------------------------------  Chemistries   Recent Labs Lab 02/04/16 1723  02/08/16 0452  NA 139  < > 136  K 4.4  < > 3.3*  CL 105  < > 108  CO2 25  < > 24  GLUCOSE 146*  < > 90  BUN 16  < > <5*  CREATININE 0.58  < > 0.43*  CALCIUM 8.9  < > 7.5*  AST 21  --   --   ALT 20  --   --   ALKPHOS 52  --   --   BILITOT 0.7  --   --   < > = values in this interval not displayed. ------------------------------------------------------------------------------------------------------------------  Cardiac Enzymes No results for input(s): TROPONINI in the last 168 hours. ------------------------------------------------------------------------------------------------------------------  RADIOLOGY:  No results found.  EKG:   Orders placed or performed during the hospital encounter of 02/04/16  . ED EKG  .  ED EKG  . EKG 12-Lead  . EKG 12-Lead    ASSESSMENT AND PLAN:   45 year old female with past medical history significant for diabetes, Addison's disease, hypothyroidism, fibromyalgia who had recent cholecystectomy done last  week presents to hospital secondary to C. difficile colitis.  #1 sepsis-secondary to severe c.diff colitis. - CT of the abd with severe diffuse colitis- still has abdominal pain, diarrhea and nausea - nausea medications-Zofran to scheduled and added Phenergan as needed. - advance to soft diet today. Appreciate GI consult -On Flagyl and vancomycin orally -Continue IV fluids and monitor closely. -Added probiotics as well. Recent antibiotic usage from gallbladder surgery could've triggered it. -Restarted Questran -WBC is improving. Pain medications adjusted - added scheduled bentyl for abdominal cramping  #2 hypertension-hold benazepril as BP low  #3 GERD-status post gastric bypass surgery. Continue Protonix  #4 diabetes mellitus-on sliding scale insulin.   #5 Hypokalemia- being replaced  #6 DVT Prophylaxis-  lovenox  Anticipate discharge tomorrow   All the records are reviewed and case discussed with Care Management/Social Workerr. Management plans discussed with the patient, family and they are in agreement.  CODE STATUS: Full Code  TOTAL TIME TAKING CARE OF THIS PATIENT: 38 minutes.   POSSIBLE D/C TOMORROW, DEPENDING ON CLINICAL CONDITION.   Enid BaasKALISETTI,Yarethzi Branan M.D on 02/08/2016 at 10:43 AM  Between 7am to 6pm - Pager - [phone]  After 6pm go to [email or web address] - Social research officer, governmentpassword [facility]AS [email or web address]  [facility]  Office  [phone]  CC: Primary care physician; Court JoyIMBROOK-DILLOW,KAREN M, PA-C

## 2016-02-09 LAB — BASIC METABOLIC PANEL
Anion gap: 5 (ref 5–15)
CALCIUM: 7.7 mg/dL — AB (ref 8.9–10.3)
CO2: 25 mmol/L (ref 22–32)
CREATININE: 0.39 mg/dL — AB (ref 0.44–1.00)
Chloride: 108 mmol/L (ref 101–111)
GFR calc non Af Amer: >60 mL/min (ref >60)
Glucose, Bld: 80 mg/dL (ref 65–99)
Potassium: 3.6 mmol/L (ref 3.5–5.1)
SODIUM: 138 mmol/L (ref 135–145)

## 2016-02-09 LAB — GLUCOSE, CAPILLARY
GLUCOSE-CAPILLARY: 72 mg/dL (ref 65–99)
GLUCOSE-CAPILLARY: 96 mg/dL (ref 65–99)

## 2016-02-09 MED ORDER — VANCOMYCIN 50 MG/ML ORAL SOLUTION: 125.0000 mg | Freq: Four times a day (QID) | ORAL | 0 refills | Status: DC

## 2016-02-09 MED ORDER — HYDROCODONE-ACETAMINOPHEN 5-325 MG PO TABS: 1.0000 | ORAL_TABLET | ORAL | 0 refills | Status: DC | PRN

## 2016-02-09 MED ORDER — RISAQUAD PO CAPS: 1.0000 | ORAL_CAPSULE | Freq: Two times a day (BID) | ORAL | 0 refills | Status: AC

## 2016-02-09 MED ORDER — DICYCLOMINE HCL 10 MG PO CAPS: 10.0000 mg | ORAL_CAPSULE | Freq: Three times a day (TID) | ORAL | 0 refills | Status: DC

## 2016-02-09 MED ORDER — METRONIDAZOLE 500 MG PO TABS: 500.0000 mg | ORAL_TABLET | Freq: Three times a day (TID) | ORAL | 0 refills | Status: DC

## 2016-02-09 MED ORDER — PROMETHAZINE HCL 25 MG PO TABS: 25.0000 mg | ORAL_TABLET | Freq: Four times a day (QID) | ORAL | 0 refills | Status: DC | PRN

## 2016-02-09 NOTE — Discharge Summary (Signed)
[facility] - [place] at [facility]   PATIENT NAME: Abigail Kim    MR#:  [id number]  DATE OF BIRTH:  04/18/1971  DATE OF ADMISSION:  02/04/2016   ADMITTING PHYSICIAN: Oralia Manis, MD  DATE OF DISCHARGE: 02/09/2016  PRIMARY CARE PHYSICIAN: Court Joy, PA-C   ADMISSION DIAGNOSIS:   Diarrhea of infectious origin [A09] Clostridium difficile diarrhea [A04.7]  DISCHARGE DIAGNOSIS:   Principal Problem:   C. difficile colitis Active Problems:   Diabetes (HCC)   HLD (hyperlipidemia)   Anxiety   Essential hypertension   GERD (gastroesophageal reflux disease)   SECONDARY DIAGNOSIS:   Past Medical History:  Diagnosis Date  . Addison's disease (HCC)   . Diabetes mellitus without complication (HCC)   . Fibromyalgia   . Hashimoto's disease   . Hashimoto's disease   . Hypercholesteremia   . Hypertension   . Major depression (HCC)   . Scoliosis   . Vitamin D deficiency     HOSPITAL COURSE:   44 year old female with past medical history significant for diabetes, Addison's disease, hypothyroidism, fibromyalgia who had recent cholecystectomy done last week presents to hospital secondary to C. difficile colitis.  #1 sepsis-secondary to severe c.diff colitis. - CT of the abd with severe diffuse colitis- improving abdominal pain, diarrhea and nausea - continue phenergan as needed -tolerating soft diet. Appreciate GI consult -On Flagyl and vancomycin orally -Added probiotics as well. Recent antibiotic usage from gallbladder surgery could've triggered it. -Restart Questran after c.diff resolves -WBC is improved. Pain medications adjusted - added scheduled bentyl for abdominal cramping  #2 hypertension- hold benazepril as BP low  #3 GERD-status post gastric bypass surgery. Continue PPI  #4 diabetes mellitus-diet controlled.   #5 Hypokalemia- being replaced  Discharge today  DISCHARGE CONDITIONS:   Guarded  CONSULTS OBTAINED:    Treatment Team:  Midge Minium, MD  DRUG ALLERGIES:   Allergies  Allergen Reactions  . Amoxicillin Diarrhea and Nausea And Vomiting  . Bupropion Hcl Other (See Comments)    REACTION: hallucinations  . Duloxetine Other (See Comments)    REACTION: dizziness  . Metformin Diarrhea  . Venlafaxine Other (See Comments)    REACTION: hallucinations   DISCHARGE MEDICATIONS:     Medication List    STOP taking these medications   benazepril 10 MG tablet Commonly known as:  LOTENSIN   cholestyramine 4 g packet Commonly known as:  QUESTRAN     TAKE these medications   acidophilus Caps capsule Take 1 capsule by mouth 2 (two) times daily.   albuterol 108 (90 Base) MCG/ACT inhaler Commonly known as:  PROVENTIL HFA;VENTOLIN HFA Inhale into the lungs every 6 (six) hours as needed for wheezing or shortness of breath.   cholecalciferol 1000 units tablet Commonly known as:  VITAMIN D Take 1,000 Units by mouth daily.   dicyclomine 10 MG capsule Commonly known as:  BENTYL Take 1 capsule (10 mg total) by mouth 3 (three) times daily before meals. X 10 days   DULoxetine 60 MG capsule Commonly known as:  CYMBALTA Take 60 mg by mouth daily.   gabapentin 100 MG capsule Commonly known as:  NEURONTIN Take 100-300 mg by mouth at bedtime as needed.   HYDROcodone-acetaminophen 5-325 MG tablet Commonly known as:  NORCO/VICODIN Take 1 tablet by mouth every 4 (four) hours as needed for moderate pain.   magnesium oxide 400 MG tablet Commonly known as:  MAG-OX Take 400 mg by mouth daily.   metroNIDAZOLE 500 MG tablet Commonly known as:  FLAGYL Take 1 tablet (500 mg total) by mouth every 8 (eight) hours. X 10 days   ondansetron 8 MG disintegrating tablet Commonly known as:  ZOFRAN-ODT Take 8 mg by mouth every 8 (eight) hours as needed for nausea/vomiting.   promethazine 25 MG tablet Commonly known as:  PHENERGAN Take 1 tablet (25 mg total) by mouth every 6 (six) hours as needed for  nausea or vomiting.   vancomycin 50 mg/mL oral solution Commonly known as:  VANCOCIN Take 2.5 mLs (125 mg total) by mouth 4 (four) times daily. X 10 days        DISCHARGE INSTRUCTIONS:   1. PCP f/u in 1-2 weeks 2. GI f/u in 1 week  DIET:   Regular diet  ACTIVITY:   Activity as tolerated  OXYGEN:   Home Oxygen: No.  Oxygen Delivery: room air  DISCHARGE LOCATION:   home   If you experience worsening of your admission symptoms, develop shortness of breath, life threatening emergency, suicidal or homicidal thoughts you must seek medical attention immediately by calling 911 or calling your MD immediately  if symptoms less severe.  You Must read complete instructions/literature along with all the possible adverse reactions/side effects for all the Medicines you take and that have been prescribed to you. Take any new Medicines after you have completely understood and accpet all the possible adverse reactions/side effects.   Please note  You were cared for by a hospitalist during your hospital stay. If you have any questions about your discharge medications or the care you received while you were in the hospital after you are discharged, you can call the unit and asked to speak with the hospitalist on call if the hospitalist that took care of you is not available. Once you are discharged, your primary care physician will handle any further medical issues. Please note that NO REFILLS for any discharge medications will be authorized once you are discharged, as it is imperative that you return to your primary care physician (or establish a relationship with a primary care physician if you do not have one) for your aftercare needs so that they can reassess your need for medications and monitor your lab values.    On the day of Discharge:  VITAL SIGNS:   Blood pressure 124/80, pulse 72, temperature 98.6 F (37 C), temperature source Oral, resp. rate 20, height 5\' 6"  (1.676 m), weight  63.1 kg (139 lb 1.6 oz), SpO2 99 %.  PHYSICAL EXAMINATION:   GENERAL:  45 y.o.-year-old patient lying in the bed with no acute distress. Feels miserable. EYES: Pupils equal, round, reactive to light and accommodation. No scleral icterus. Extraocular muscles intact.  HEENT: Head atraumatic, normocephalic. Oropharynx and nasopharynx clear.  NECK:  Supple, no jugular venous distention. No thyroid enlargement, no tenderness.  LUNGS: Normal breath sounds bilaterally, no wheezing, rales,rhonchi or crepitation. No use of accessory muscles of respiration.  CARDIOVASCULAR: S1, S2 normal. No murmurs, rubs, or gallops.  ABDOMEN: Soft, diffuse tenderness in the lower abdomen, no guarding or rigidity, nondistended. Bowel sounds present. No organomegaly or mass.  EXTREMITIES: No pedal edema, cyanosis, or clubbing.  NEUROLOGIC: Cranial nerves II through XII are intact. Muscle strength 5/5 in all extremities. Sensation intact. Gait not checked.  PSYCHIATRIC: The patient is alert and oriented x 3.  SKIN: No obvious rash, lesion, or ulcer.     DATA REVIEW:   CBC  Recent Labs Lab 02/08/16 0452  WBC 10.7  HGB 9.9*  HCT 30.1*  PLT 398  Chemistries   Recent Labs Lab 02/04/16 1723  02/09/16 0410  NA 139  < > 138  K 4.4  < > 3.6  CL 105  < > 108  CO2 25  < > 25  GLUCOSE 146*  < > 80  BUN 16  < > <5*  CREATININE 0.58  < > 0.39*  CALCIUM 8.9  < > 7.7*  AST 21  --   --   ALT 20  --   --   ALKPHOS 52  --   --   BILITOT 0.7  --   --   < > = values in this interval not displayed.   Microbiology Results  Results for orders placed or performed during the hospital encounter of 02/04/16  Gastrointestinal Panel by PCR , Stool     Status: None   Collection Time: 02/04/16  7:00 PM  Result Value Ref Range Status   Campylobacter species NOT DETECTED NOT DETECTED Final   Plesimonas shigelloides NOT DETECTED NOT DETECTED Final   Salmonella species NOT DETECTED NOT DETECTED Final   Yersinia  enterocolitica NOT DETECTED NOT DETECTED Final   Vibrio species NOT DETECTED NOT DETECTED Final   Vibrio cholerae NOT DETECTED NOT DETECTED Final   Enteroaggregative E coli (EAEC) NOT DETECTED NOT DETECTED Final   Enteropathogenic E coli (EPEC) NOT DETECTED NOT DETECTED Final   Enterotoxigenic E coli (ETEC) NOT DETECTED NOT DETECTED Final   Shiga like toxin producing E coli (STEC) NOT DETECTED NOT DETECTED Final   Shigella/Enteroinvasive E coli (EIEC) NOT DETECTED NOT DETECTED Final   Cryptosporidium NOT DETECTED NOT DETECTED Final   Cyclospora cayetanensis NOT DETECTED NOT DETECTED Final   Entamoeba histolytica NOT DETECTED NOT DETECTED Final   Giardia lamblia NOT DETECTED NOT DETECTED Final   Adenovirus F40/41 NOT DETECTED NOT DETECTED Final   Astrovirus NOT DETECTED NOT DETECTED Final   Norovirus GI/GII NOT DETECTED NOT DETECTED Final   Rotavirus A NOT DETECTED NOT DETECTED Final   Sapovirus (I, II, IV, and V) NOT DETECTED NOT DETECTED Final  C difficile quick scan w PCR reflex     Status: Abnormal   Collection Time: 02/04/16  7:00 PM  Result Value Ref Range Status   C Diff antigen POSITIVE (A) NEGATIVE Final   C Diff toxin POSITIVE (A) NEGATIVE Final   C Diff interpretation Toxin producing C. difficile detected.  Final    Comment: CRITICAL RESULT CALLED TO, READ BACK BY AND VERIFIED WITH: [facility] LOEJAR 02/04/16 AT 2009 BY HS     RADIOLOGY:  No results found.   Management plans discussed with the patient, family and they are in agreement.  CODE STATUS:     Code Status Orders        Start     Ordered   02/04/16 2214  Full code  Continuous     02/04/16 2213    Code Status History    Date Active Date Inactive Code Status Order ID Comments User Context   This patient has a current code status but no historical code status.      TOTAL TIME TAKING CARE OF THIS PATIENT: 37 minutes.    Enid Baas M.D on 02/09/2016 at 2:47 PM  Between 7am to 6pm - Pager -  [phone]  After 6pm go to [email or web address] - Social research officer, government  [facility] [place] [facility]  Office  [phone]  CC: Primary care physician; Court Joy, PA-C   Note: This dictation was prepared with Dragon dictation along with  smaller phrase technology. Any transcriptional errors that result from this process are unintentional.

## 2016-02-09 NOTE — Progress Notes (Signed)
     Abigail JohnsGloria Raphael was admitted to the [facility] on 02/04/2016 for an acute medical condition and is being Discharged on  02/09/2016 . She is still recovering and will need another 7 days for recovery while she is on the treatment and so advised to stay away from work until then. So please excuse her from work/ for the above mentioned  Days. Should be able to return to work without any restrictions from 02/17/16.  Call Enid Baasadhika Tylon Kemmerling  MD, [facility] at  [phone] with questions.  Enid BaasKALISETTI,Hamed Debella M.D on 02/09/2016,at 2:50 PM  [facility] [address], [place] [id number]

## 2016-02-22 ENCOUNTER — Encounter: Payer: Self-pay | Admitting: Gastroenterology

## 2016-02-22 ENCOUNTER — Ambulatory Visit (INDEPENDENT_AMBULATORY_CARE_PROVIDER_SITE_OTHER): Payer: BC Managed Care – PPO | Admitting: Gastroenterology

## 2016-02-22 ENCOUNTER — Other Ambulatory Visit: Payer: Self-pay

## 2016-02-22 VITALS — BP 120/77 | HR 71 | Ht 66.0 in | Wt 136.5 lb

## 2016-02-22 DIAGNOSIS — A0472 Enterocolitis due to Clostridium difficile, not specified as recurrent: Secondary | ICD-10-CM

## 2016-02-22 DIAGNOSIS — A047 Enterocolitis due to Clostridium difficile: Secondary | ICD-10-CM

## 2016-02-22 NOTE — Progress Notes (Signed)
Primary Care Physician: Court Joy, PA-C  Primary Gastroenterologist:  Dr. Midge Minium  Chief Complaint  Patient presents with  . Hospitalization Follow-up    C -diff colitis    HPI: Abigail Kim is a 45 y.o. female here for follow-up after having been in the hospital for C. difficile colitis. The patient finished her antibiotic yesterday. She also reports that she is taking probiotics. She is feeling well without any problems at the present time and states that she feels back to her normal self.  Current Outpatient Prescriptions  Medication Sig Dispense Refill  . acidophilus (RISAQUAD) CAPS capsule Take 1 capsule by mouth 2 (two) times daily. 20 capsule 0  . albuterol (PROVENTIL HFA;VENTOLIN HFA) 108 (90 BASE) MCG/ACT inhaler Inhale into the lungs every 6 (six) hours as needed for wheezing or shortness of breath.    . benazepril (LOTENSIN) 10 MG tablet     . cholecalciferol (VITAMIN D) 1000 units tablet Take 1,000 Units by mouth daily.    . DULoxetine (CYMBALTA) 60 MG capsule Take 60 mg by mouth daily.    Marland Kitchen gabapentin (NEURONTIN) 100 MG capsule Take 100-300 mg by mouth at bedtime as needed.    . magnesium oxide (MAG-OX) 400 MG tablet Take 400 mg by mouth daily.     No current facility-administered medications for this visit.     Allergies as of 02/22/2016 - Review Complete 02/22/2016  Allergen Reaction Noted  . Atorvastatin Other (See Comments) 08/19/2013  . Hydrocodone-acetaminophen Nausea Only 01/19/2016  . Ibuprofen  05/11/2015  . Amoxicillin Diarrhea and Nausea And Vomiting 09/25/2006  . Bupropion hcl Other (See Comments) 09/25/2006  . Duloxetine Other (See Comments) 09/25/2006  . Metformin Diarrhea 09/25/2006  . Oxycodone Nausea Only 03/25/2015  . Venlafaxine Other (See Comments) 09/25/2006  . Metformin and related Diarrhea 08/19/2013  . Tape Rash 01/22/2016    ROS:  General: Negative for anorexia, weight loss, fever, chills, fatigue,  weakness. ENT: Negative for hoarseness, difficulty swallowing , nasal congestion. CV: Negative for chest pain, angina, palpitations, dyspnea on exertion, peripheral edema.  Respiratory: Negative for dyspnea at rest, dyspnea on exertion, cough, sputum, wheezing.  GI: See history of present illness. GU:  Negative for dysuria, hematuria, urinary incontinence, urinary frequency, nocturnal urination.  Endo: Negative for unusual weight change.    Physical Examination:   BP 120/77   Pulse 71   Ht 5\' 6"  (1.676 m)   Wt 136 lb 8 oz (61.9 kg)   BMI 22.03 kg/m   General: Well-nourished, well-developed in no acute distress.  Eyes: No icterus. Conjunctivae pink. Mouth: Oropharyngeal mucosa moist and pink , no lesions erythema or exudate. Lungs: Clear to auscultation bilaterally. Non-labored. Heart: Regular rate and rhythm, no murmurs rubs or gallops.  Abdomen: Bowel sounds are normal, nontender, nondistended, no hepatosplenomegaly or masses, no abdominal bruits or hernia , no rebound or guarding.   Extremities: No lower extremity edema. No clubbing or deformities. Neuro: Alert and oriented x 3.  Grossly intact. Skin: Warm and dry, no jaundice.   Psych: Alert and cooperative, normal mood and affect.  Labs:    Imaging Studies: Ct Abdomen Pelvis W Contrast  Result Date: 02/05/2016 CLINICAL DATA:  Severe abdominal pain, diarrhea EXAM: CT ABDOMEN AND PELVIS WITH CONTRAST TECHNIQUE: Multidetector CT imaging of the abdomen and pelvis was performed using the standard protocol following bolus administration of intravenous contrast. CONTRAST:  ISOVUE-300 IOPAMIDOL (ISOVUE-300) INJECTION 61% COMPARISON:  CT scan 08/22/2011 FINDINGS: Lower chest: The lung bases  are unremarkable.  Small hiatal hernia. Hepatobiliary: Enhanced liver is unremarkable. Status postcholecystectomy. Pancreas: Enhanced pancreas is unremarkable. Spleen: Normal in size without focal abnormality. Adrenals/Urinary Tract: No adrenal  gland mass. No hydronephrosis or hydroureter. Delayed renal images shows bilateral renal symmetrical excretion. Bilateral visualized proximal ureter is unremarkable. Stomach/Bowel: No small bowel obstruction. There is diffuse thickening of colonic wall and enhancement of the mucosa in right colon transverse colon descending colon and rectosigmoid colon. Findings are consistent with diffuse colitis. Clostridium difficile colitis is a clinical consideration. No pericolonic abscess or perforation. No colonic obstruction. Status post gastric bypass surgery. Vascular/Lymphatic: No retroperitoneal or mesenteric adenopathy. No aortic aneurysm. Reproductive: The uterus is anteflexed. No adnexal mass. Minimal congested left adnexal vessels. Other: No ascites or free abdominal air. There is mild deep tanning of the skin in umbilical region. Mild cellulitis cannot be excluded. Musculoskeletal: No destructive bony lesions are noted. Sagittal images of the spine shows mild degenerative changes thoracolumbar spine. IMPRESSION: 1. There is diffuse thickening of colonic wall and enhancement of the mucosa consistent with diffuse colitis. No pericolonic abscess or perforation is noted. 2. No small bowel obstruction. 3. No hydronephrosis or hydroureter. 4. Status post gastric bypass surgery. 5. Minimal congested left adnexal vessels.  No adnexal mass. Electronically Signed   By: Natasha MeadLiviu  Pop M.D.   On: 02/05/2016 16:11    Assessment and Plan:   Abigail JaegerGloria P Kim is a 45 y.o. y/o female with a recent admission to the hospital for C. difficile colitis. The patient has been doing well without any problems related to her C. difficile. The patient will continue her probiotics. She has been told to contact us if the diarrhea returns and she has also been warned that she should be taking antibiotics to avoid future C. difficile infections. Th patient has been explained the plan and agrees with it.   Note: This dictation was prepared  with Dragon dictation along with smaller phrase technology. Any transcriptional errors that result from this process are unintentional.

## 2016-03-08 ENCOUNTER — Telehealth: Payer: Self-pay

## 2016-03-08 NOTE — Telephone Encounter (Signed)
Pt called today stating she starting having diarrhea with some nausea this morning. Pt has a hx of C-diff and was advised by Dr. Servando SnareWohl at her last ov on 02/22/16 if diarrhea returns to contact our office. No abnormal odor or blood noted. Will monitor this today and call back later today if diarrhea continues.

## 2016-03-09 ENCOUNTER — Other Ambulatory Visit: Payer: Self-pay

## 2016-03-09 DIAGNOSIS — Z8619 Personal history of other infectious and parasitic diseases: Secondary | ICD-10-CM

## 2016-03-09 DIAGNOSIS — R197 Diarrhea, unspecified: Secondary | ICD-10-CM

## 2016-03-09 NOTE — Telephone Encounter (Signed)
Patient has called back and stated that she is calling back to let Ginger know that she is continuing with diarrhea, nausea and abdominal pain all through the night including headaches.  Patient has concerns due to her working around [employer].   Patient has a history of C-diff.   Please call patient with advise.

## 2016-03-09 NOTE — Telephone Encounter (Signed)
Advised pt since she has been having these symptoms for several days now, we are going to order a STAT c-diff test. Pt [facility] lab.

## 2016-03-10 ENCOUNTER — Telehealth: Payer: Self-pay | Admitting: General Surgery

## 2016-03-10 ENCOUNTER — Other Ambulatory Visit
Admission: RE | Admit: 2016-03-10 | Discharge: 2016-03-10 | Disposition: A | Discharge status: Home / Self Care | Payer: BC Managed Care – PPO | Source: Ambulatory Visit | Attending: Gastroenterology | Admitting: Gastroenterology

## 2016-03-10 DIAGNOSIS — Z8619 Personal history of other infectious and parasitic diseases: Secondary | ICD-10-CM | POA: Insufficient documentation

## 2016-03-10 DIAGNOSIS — A0472 Enterocolitis due to Clostridium difficile, not specified as recurrent: Secondary | ICD-10-CM

## 2016-03-10 DIAGNOSIS — R197 Diarrhea, unspecified: Secondary | ICD-10-CM | POA: Diagnosis present

## 2016-03-10 LAB — C DIFFICILE QUICK SCREEN W PCR REFLEX
C DIFFICILE (CDIFF) TOXIN: NEGATIVE
C Diff antigen: POSITIVE — AB

## 2016-03-10 LAB — CLOSTRIDIUM DIFFICILE BY PCR: Toxigenic C. Difficile by PCR: POSITIVE — AB

## 2016-03-10 MED ORDER — METRONIDAZOLE 500 MG PO TABS: 500.0000 mg | ORAL_TABLET | Freq: Three times a day (TID) | ORAL | 0 refills | Status: AC

## 2016-03-10 MED ORDER — PROMETHAZINE HCL 12.5 MG PO TABS: 12.5000 mg | ORAL_TABLET | Freq: Four times a day (QID) | ORAL | 0 refills | Status: DC | PRN

## 2016-03-10 NOTE — Telephone Encounter (Signed)
  Contact by the lab about the patient's C. difficile positive test.  Call the patient and related the results. Patient reports she is having some abdominal pain and nausea in association with her diarrhea.  Patient questioned how long she needs to be out of work. Informed her that Dr. Servando SnareWohl will contact her tomorrow to see how she is doing and to advise on whether or not to return to work.  Prescriptions for Flagyl and Phenergan sent into her pharmacy of record.  Patient will call back or reports the emergency room after any other concerns or worsening.  Ricarda Frameharles Elmor Kost, MD [facility] General Surgeon [facility]

## 2016-03-13 ENCOUNTER — Telehealth: Payer: Self-pay

## 2016-03-13 NOTE — Telephone Encounter (Signed)
Contacted pt and advised her she will need to stay out of work until diarrhea has cleared. FMLA paperwork will need to be completed. Pt will bring by for me to do.

## 2016-03-13 NOTE — Telephone Encounter (Signed)
-----   Message from Midge Miniumarren Wohl, MD sent at 03/13/2016  6:38 AM EDT ----- This patient was found to be C. Difficile Positive  Over the weekend.  Dr. Tonita CongWoodham Was supposed to start her on Flagyl.  Please check if the patient was started on antibiotics.

## 2016-03-13 NOTE — Telephone Encounter (Signed)
Advised pt she is to stay out of work until her diarrhea goes away. She has FMLA paperwork she has asked me to complete. [facility]. Also, went over precautions for C-diff for her home and family.

## 2016-03-13 NOTE — Telephone Encounter (Signed)
Okay 

## 2016-03-16 ENCOUNTER — Telehealth: Payer: Self-pay | Admitting: Gastroenterology

## 2016-03-16 ENCOUNTER — Other Ambulatory Visit: Payer: Self-pay

## 2016-03-16 MED ORDER — VANCOMYCIN HCL 125 MG PO CAPS: 125.0000 mg | ORAL_CAPSULE | Freq: Four times a day (QID) | ORAL | 0 refills | Status: DC

## 2016-03-16 NOTE — Telephone Encounter (Signed)
Spoke with pt regarding her symptoms and she stated she thought her diarrhea was getting better but today she has had 4 runny stools and feeling nauseated. Pt started Flagyl last Saturday, Oct 14th. Per Dr. Servando SnareWohl, pt is to stop Flagyl and start Vancomycin 125mg  QID x 14 days. Pt notified and rx has been sent to her pharmacy.

## 2016-03-16 NOTE — Telephone Encounter (Signed)
C diff positive - feels it is getting worse. Does she need to be on a different antibiotic since this is the 3rd time?

## 2016-04-17 ENCOUNTER — Telehealth: Payer: Self-pay | Admitting: Gastroenterology

## 2016-04-17 DIAGNOSIS — R197 Diarrhea, unspecified: Secondary | ICD-10-CM

## 2016-04-17 NOTE — Telephone Encounter (Signed)
Notification sent to Dr. Servando SnareWohl at this time. Awaiting orders.

## 2016-04-17 NOTE — Telephone Encounter (Signed)
Patient has been off her Vancomycin for 4-5 days now for C diff and starting to get diarrhea again. She is going 2-3 times a day. She was wondering if someone can write an order for her to be check again for c diff or is it to early? She wanted to have it done Friday. Please call

## 2016-04-17 NOTE — Telephone Encounter (Signed)
Orders received from Dr. Servando SnareWohl to get STAT C-Diff for patient. Orders have been placed and [facility] as she is out of town at the moment.  Will check back for results in am.

## 2016-04-20 ENCOUNTER — Other Ambulatory Visit
Admission: RE | Admit: 2016-04-20 | Discharge: 2016-04-20 | Disposition: A | Discharge status: Home / Self Care | Payer: BC Managed Care – PPO | Source: Other Acute Inpatient Hospital | Attending: Gastroenterology | Admitting: Gastroenterology

## 2016-04-20 DIAGNOSIS — A09 Infectious gastroenteritis and colitis, unspecified: Secondary | ICD-10-CM | POA: Insufficient documentation

## 2016-04-20 LAB — C DIFFICILE QUICK SCREEN W PCR REFLEX
C DIFFICLE (CDIFF) ANTIGEN: POSITIVE — AB
C Diff toxin: NEGATIVE

## 2016-04-20 LAB — CLOSTRIDIUM DIFFICILE BY PCR

## 2016-04-24 ENCOUNTER — Encounter: Payer: Self-pay | Admitting: Gastroenterology

## 2016-04-30 ENCOUNTER — Other Ambulatory Visit
Admission: RE | Admit: 2016-04-30 | Discharge: 2016-04-30 | Disposition: A | Discharge status: Home / Self Care | Payer: BC Managed Care – PPO | Source: Ambulatory Visit | Attending: Gastroenterology | Admitting: Gastroenterology

## 2016-04-30 ENCOUNTER — Other Ambulatory Visit: Payer: Self-pay

## 2016-04-30 DIAGNOSIS — Z8619 Personal history of other infectious and parasitic diseases: Secondary | ICD-10-CM

## 2016-04-30 DIAGNOSIS — R197 Diarrhea, unspecified: Secondary | ICD-10-CM

## 2016-04-30 LAB — C DIFFICILE QUICK SCREEN W PCR REFLEX
C DIFFICILE (CDIFF) TOXIN: NEGATIVE
C Diff antigen: POSITIVE — AB

## 2016-04-30 LAB — CLOSTRIDIUM DIFFICILE BY PCR: CDIFFPCR: NEGATIVE

## 2016-04-30 MED ORDER — VANCOMYCIN HCL 125 MG PO CAPS: 125.0000 mg | ORAL_CAPSULE | Freq: Four times a day (QID) | ORAL | 0 refills | Status: AC

## 2017-02-12 DIAGNOSIS — Z8639 Personal history of other endocrine, nutritional and metabolic disease: Secondary | ICD-10-CM | POA: Insufficient documentation

## 2017-02-14 DIAGNOSIS — D649 Anemia, unspecified: Secondary | ICD-10-CM | POA: Insufficient documentation

## 2017-04-15 ENCOUNTER — Emergency Department
Admission: EM | Admit: 2017-04-15 | Discharge: 2017-04-15 | Disposition: A | Discharge status: Home / Self Care | Payer: BC Managed Care – PPO | Attending: Emergency Medicine | Admitting: Emergency Medicine

## 2017-04-15 ENCOUNTER — Encounter: Payer: Self-pay | Admitting: Radiology

## 2017-04-15 ENCOUNTER — Emergency Department: Payer: BC Managed Care – PPO

## 2017-04-15 DIAGNOSIS — E119 Type 2 diabetes mellitus without complications: Secondary | ICD-10-CM | POA: Insufficient documentation

## 2017-04-15 DIAGNOSIS — R110 Nausea: Secondary | ICD-10-CM

## 2017-04-15 DIAGNOSIS — E271 Primary adrenocortical insufficiency: Secondary | ICD-10-CM | POA: Insufficient documentation

## 2017-04-15 DIAGNOSIS — R197 Diarrhea, unspecified: Secondary | ICD-10-CM | POA: Insufficient documentation

## 2017-04-15 DIAGNOSIS — R1110 Vomiting, unspecified: Secondary | ICD-10-CM | POA: Insufficient documentation

## 2017-04-15 DIAGNOSIS — E063 Autoimmune thyroiditis: Secondary | ICD-10-CM | POA: Diagnosis not present

## 2017-04-15 DIAGNOSIS — I10 Essential (primary) hypertension: Secondary | ICD-10-CM | POA: Diagnosis not present

## 2017-04-15 DIAGNOSIS — Z87891 Personal history of nicotine dependence: Secondary | ICD-10-CM | POA: Insufficient documentation

## 2017-04-15 DIAGNOSIS — Z79899 Other long term (current) drug therapy: Secondary | ICD-10-CM | POA: Diagnosis not present

## 2017-04-15 LAB — URINALYSIS, COMPLETE (UACMP) WITH MICROSCOPIC
BILIRUBIN URINE: NEGATIVE
Bacteria, UA: NONE SEEN
Glucose, UA: NEGATIVE mg/dL
HGB URINE DIPSTICK: NEGATIVE
Ketones, ur: 5 mg/dL — AB
LEUKOCYTES UA: NEGATIVE
NITRITE: NEGATIVE
PH: 5 (ref 5.0–8.0)
Protein, ur: 30 mg/dL — AB
SPECIFIC GRAVITY, URINE: 1.025 (ref 1.005–1.030)

## 2017-04-15 LAB — COMPREHENSIVE METABOLIC PANEL
ALT: 18 U/L (ref 14–54)
ANION GAP: 8 (ref 5–15)
AST: 25 U/L (ref 15–41)
Albumin: 4.1 g/dL (ref 3.5–5.0)
Alkaline Phosphatase: 70 U/L (ref 38–126)
BUN: 10 mg/dL (ref 6–20)
CALCIUM: 9 mg/dL (ref 8.9–10.3)
CHLORIDE: 103 mmol/L (ref 101–111)
CO2: 25 mmol/L (ref 22–32)
CREATININE: 0.56 mg/dL (ref 0.44–1.00)
GFR calc non Af Amer: >60 mL/min (ref >60)
Glucose, Bld: 98 mg/dL (ref 65–99)
POTASSIUM: 3.5 mmol/L (ref 3.5–5.1)
SODIUM: 136 mmol/L (ref 135–145)
TOTAL PROTEIN: 7.5 g/dL (ref 6.5–8.1)
Total Bilirubin: 0.5 mg/dL (ref 0.3–1.2)

## 2017-04-15 LAB — CBC
HCT: 35.3 % (ref 35.0–47.0)
Hemoglobin: 11.1 g/dL — ABNORMAL LOW (ref 12.0–16.0)
MCH: 21 pg — ABNORMAL LOW (ref 26.0–34.0)
MCHC: 31.6 g/dL — AB (ref 32.0–36.0)
MCV: 66.7 fL — ABNORMAL LOW (ref 80.0–100.0)
PLATELETS: 378 10*3/uL (ref 150–440)
RBC: 5.29 MIL/uL — ABNORMAL HIGH (ref 3.80–5.20)
RDW: 17.2 % — AB (ref 11.5–14.5)
WBC: 5.7 10*3/uL (ref 3.6–11.0)

## 2017-04-15 LAB — LACTIC ACID, PLASMA: Lactic Acid, Venous: 1 mmol/L (ref 0.5–1.9)

## 2017-04-15 LAB — LIPASE, BLOOD: LIPASE: 39 U/L (ref 11–51)

## 2017-04-15 MED ORDER — IOPAMIDOL (ISOVUE-300) INJECTION 61%
30.0000 mL | Freq: Once | INTRAVENOUS | Status: AC
  Administered 2017-04-15: 30 mL via ORAL

## 2017-04-15 MED ORDER — MORPHINE SULFATE (PF) 4 MG/ML IV SOLN
6.0000 mg | Freq: Once | INTRAVENOUS | Status: AC
  Administered 2017-04-15: 6 mg via INTRAVENOUS
  Filled 2017-04-15: qty 2

## 2017-04-15 MED ORDER — OXYCODONE-ACETAMINOPHEN 5-325 MG PO TABS: 1.0000 | ORAL_TABLET | Freq: Four times a day (QID) | ORAL | 0 refills | Status: AC | PRN

## 2017-04-15 MED ORDER — ONDANSETRON HCL 4 MG/2ML IJ SOLN
4.0000 mg | Freq: Once | INTRAMUSCULAR | Status: AC
  Administered 2017-04-15: 4 mg via INTRAVENOUS
  Filled 2017-04-15: qty 2

## 2017-04-15 MED ORDER — SODIUM CHLORIDE 0.9 % IV BOLUS (SEPSIS)
1000.0000 mL | Freq: Once | INTRAVENOUS | Status: AC
  Administered 2017-04-15: 1000 mL via INTRAVENOUS

## 2017-04-15 MED ORDER — ONDANSETRON 4 MG PO TBDP: 4.0000 mg | ORAL_TABLET | Freq: Three times a day (TID) | ORAL | 0 refills | Status: AC | PRN

## 2017-04-15 MED ORDER — IOPAMIDOL (ISOVUE-300) INJECTION 61%
100.0000 mL | Freq: Once | INTRAVENOUS | Status: AC | PRN
  Administered 2017-04-15: 100 mL via INTRAVENOUS

## 2017-04-15 NOTE — Discharge Instructions (Signed)
Please make sure you remain well-hydrated and follow-up with your GI doctor this week for a recheck.  Return to the ED for any concerns.  It was a pleasure to take care of you today, and thank you for coming to our emergency department.  If you have any questions or concerns before leaving please ask the nurse to grab me and I'm more than happy to go through your aftercare instructions again.  If you were prescribed any opioid pain medication today such as Norco, Vicodin, Percocet, morphine, hydrocodone, or oxycodone please make sure you do not drive when you are taking this medication as it can alter your ability to drive safely.  If you have any concerns once you are home that you are not improving or are in fact getting worse before you can make it to your follow-up appointment, please do not hesitate to call 911 and come back for further evaluation.  Merrily BrittleNeil Bristal Steffy, MD  Results for orders placed or performed during the hospital encounter of 04/15/17  Lipase, blood  Result Value Ref Range   Lipase 39 11 - 51 U/L  Comprehensive metabolic panel  Result Value Ref Range   Sodium 136 135 - 145 mmol/L   Potassium 3.5 3.5 - 5.1 mmol/L   Chloride 103 101 - 111 mmol/L   CO2 25 22 - 32 mmol/L   Glucose, Bld 98 65 - 99 mg/dL   BUN 10 6 - 20 mg/dL   Creatinine, Ser 7.820.56 0.44 - 1.00 mg/dL   Calcium 9.0 8.9 - 95.610.3 mg/dL   Total Protein 7.5 6.5 - 8.1 g/dL   Albumin 4.1 3.5 - 5.0 g/dL   AST 25 15 - 41 U/L   ALT 18 14 - 54 U/L   Alkaline Phosphatase 70 38 - 126 U/L   Total Bilirubin 0.5 0.3 - 1.2 mg/dL   GFR calc non Af Amer >60 >60 mL/min   GFR calc Af Amer >60 >60 mL/min   Anion gap 8 5 - 15  CBC  Result Value Ref Range   WBC 5.7 3.6 - 11.0 K/uL   RBC 5.29 (H) 3.80 - 5.20 MIL/uL   Hemoglobin 11.1 (L) 12.0 - 16.0 g/dL   HCT 21.335.3 08.635.0 - 57.847.0 %   MCV 66.7 (L) 80.0 - 100.0 fL   MCH 21.0 (L) 26.0 - 34.0 pg   MCHC 31.6 (L) 32.0 - 36.0 g/dL   RDW 46.917.2 (H) 62.911.5 - 52.814.5 %   Platelets 378 150 -  440 K/uL  Urinalysis, Complete w Microscopic  Result Value Ref Range   Color, Urine AMBER (A) YELLOW   APPearance CLOUDY (A) CLEAR   Specific Gravity, Urine 1.025 1.005 - 1.030   pH 5.0 5.0 - 8.0   Glucose, UA NEGATIVE NEGATIVE mg/dL   Hgb urine dipstick NEGATIVE NEGATIVE   Bilirubin Urine NEGATIVE NEGATIVE   Ketones, ur 5 (A) NEGATIVE mg/dL   Protein, ur 30 (A) NEGATIVE mg/dL   Nitrite NEGATIVE NEGATIVE   Leukocytes, UA NEGATIVE NEGATIVE   RBC / HPF 6-30 0 - 5 RBC/hpf   WBC, UA 6-30 0 - 5 WBC/hpf   Bacteria, UA NONE SEEN NONE SEEN   Squamous Epithelial / LPF TOO NUMEROUS TO COUNT (A) NONE SEEN   Mucus PRESENT   Lactic acid, plasma  Result Value Ref Range   Lactic Acid, Venous 1.0 0.5 - 1.9 mmol/L   Ct Abdomen Pelvis W Contrast  Result Date: 04/15/2017 CLINICAL DATA:  Abdominal distention EXAM: CT ABDOMEN AND  PELVIS WITH CONTRAST TECHNIQUE: Multidetector CT imaging of the abdomen and pelvis was performed using the standard protocol following bolus administration of intravenous contrast. CONTRAST:  100mL ISOVUE-300 IOPAMIDOL (ISOVUE-300) INJECTION 61% COMPARISON:  CT abdomen pelvis 02/05/2016 FINDINGS: Lower chest: No pulmonary nodules or pleural effusion. No visible pericardial effusion. Hepatobiliary: Normal hepatic contours and density. No visible biliary dilatation. Status post cholecystectomy. Pancreas: Normal contours without ductal dilatation. No peripancreatic fluid collection. Spleen: Normal. Adrenals/Urinary Tract: --Adrenal glands: Normal. --Right kidney/ureter: No hydronephrosis or perinephric stranding. No nephrolithiasis. No obstructing ureteral stones. --Left kidney/ureter: No hydronephrosis or perinephric stranding. No nephrolithiasis. No obstructing ureteral stones. --Urinary bladder: Unremarkable. Stomach/Bowel: --Stomach/Duodenum: Status post gastric bypass. Small sliding hiatal hernia, unchanged. --Small bowel: No dilatation or inflammation. --Colon: No focal abnormality.  --Appendix: Normal. Vascular/Lymphatic: Atherosclerotic calcification is present within the non-aneurysmal abdominal aorta, without hemodynamically significant stenosis. No abdominal or pelvic lymphadenopathy. Reproductive: The uterus and left ovary are normal. There is a peripherally enhancing right ovarian foci, likely a resolving corpus luteum. Small amount of fluid in the posterior cul-de-sac is likely physiologic. Musculoskeletal. No bony spinal canal stenosis or focal osseous abnormality. Other: None. IMPRESSION: 1. No acute abdominopelvic abnormality. 2.  Aortic Atherosclerosis (ICD10-I70.0). Electronically Signed   By: Deatra RobinsonKevin  Herman M.D.   On: 04/15/2017 22:27

## 2017-04-15 NOTE — ED Notes (Signed)
Patient transported to CT 

## 2017-04-15 NOTE — ED Provider Notes (Signed)
[facility] Emergency Department Provider Note  ____________________________________________   First MD Initiated Contact with Patient 04/15/17 2024     (approximate)  I have reviewed the triage vital signs and the nursing notes.   HISTORY  Chief Complaint Diarrhea   HPI Abigail Kim is a [age] y.o. female self presents the emergency department with moderate to severe cramping diffuse upper abdominal discomfort that initially began last night.  The pain lasted from about 5:30 PM to 9 PM.  It seemed to begin when she ate food but nothing in particular made it better or worse.  It came on suddenly and was constant.  The pain subsided but then recurred again this afternoon which prompted the visit to the emergency department.  She has had 3 loose stools today.  She has had nausea but no vomiting.  No fevers or chills.  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  This was complicated by subsequent C. difficile colitis and the patient is concerned that she may have C. difficile again.  She has no sick contacts at home.  Pain is severe constant aching nonradiating.  Nothing seems to make it better or worse.  Past Medical History:  Diagnosis Date  . Addison's disease (HCC)   . Diabetes mellitus without complication (HCC)   . Fibromyalgia   . Hashimoto's disease   . Hashimoto's disease   . Hypercholesteremia   . Hypertension   . Major depression   . Scoliosis   . Vitamin D deficiency     Patient Active Problem List   Diagnosis Date Noted  . GERD (gastroesophageal reflux disease) 02/04/2016  . C. difficile colitis 02/04/2016  . S/P cholecystectomy 01/23/2016  . 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 Chronic back pain 11/10/2014  . Fibromyalgia 11/10/2014  . Diabetes mellitus type 2 in obese (HCC) 10/28/2014  . Family history of early CAD 10/26/2014  . FH: CAD (coronary artery disease) 10/21/2014  . Chronic recurrent major  depressive disorder (HCC) 05/18/2014  . Hidradenitis suppurativa 02/10/2014  . Class 1 obesity 08/19/2013  . Addison disease (HCC) 03/30/2013  . Hashimoto's thyroiditis 03/30/2013  . DYSURIA 08/17/2008  . UNSPECIFIED VITAMIN D DEFICIENCY 06/22/2008  . NECK PAIN 05/27/2008  . DISTURBANCE OF SKIN SENSATION 05/27/2008  . FATIGUE 09/09/2007  . Lymphocytic thyroiditis 09/25/2006  . Diabetes (HCC) 09/25/2006  . HLD (hyperlipidemia) 09/25/2006  . Anxiety 09/25/2006  . PTSD 09/25/2006  . DEPRESSION 09/25/2006  . Essential hypertension 09/25/2006  . ALLERGIC RHINITIS 09/25/2006  . REACTIVE AIRWAY DISEASE 09/25/2006  . IRREGULAR MENSES 09/25/2006  . MURMUR 09/25/2006  . CERVICAL LYMPHADENOPATHY 09/25/2006  . PROTEINURIA 09/25/2006  . TOBACCO USE, QUIT 09/25/2006    Past Surgical History:  Procedure Laterality Date  . CHOLECYSTECTOMY    . GASTRIC BYPASS  02/07/2015    Prior to Admission medications   Medication Sig Start Date End Date Taking? Authorizing Provider  acidophilus (RISAQUAD) CAPS capsule Take 1 capsule by mouth 2 (two) times daily. 02/09/16   Enid BaasKalisetti, Radhika, MD  albuterol (PROVENTIL HFA;VENTOLIN HFA) 108 (90 BASE) MCG/ACT inhaler Inhale into the lungs every 6 (six) hours as needed for wheezing or shortness of breath.    [provider]  benazepril (LOTENSIN) 10 MG tablet  02/18/16   [provider]  cholecalciferol (VITAMIN D) 1000 units tablet Take 1,000 Units by mouth daily.    [provider]  Cyanocobalamin (VITAMIN B-12) 1000 MCG SUBL Place 1 tablet  under the tongue daily.    [provider]  DULoxetine (CYMBALTA) 60 MG capsule Take 60 mg by mouth daily.    [provider]  gabapentin (NEURONTIN) 100 MG capsule Take 100-300 mg by mouth at bedtime as needed.    [provider]  magnesium oxide (MAG-OX) 400 MG tablet Take 400 mg by mouth daily.    [provider]  ondansetron (ZOFRAN ODT) 4 MG  disintegrating tablet Take 1 tablet (4 mg total) every 8 (eight) hours as needed by mouth for nausea or vomiting. 04/15/17   Merrily Brittle, MD  oxyCODONE-acetaminophen (ROXICET) 5-325 MG tablet Take 1 tablet every 6 (six) hours as needed by mouth for severe pain. 04/15/17   Merrily Brittle, MD  promethazine (PHENERGAN) 12.5 MG tablet Take 1 tablet (12.5 mg total) by mouth every 6 (six) hours as needed for nausea or vomiting. 03/10/16   Ricarda Frame, MD    Allergies Atorvastatin; Hydrocodone-acetaminophen; Ibuprofen; Amoxicillin; Bupropion hcl; Duloxetine; Metformin; Oxycodone; Venlafaxine; Metformin and related; and Tape  Family History  Problem Relation Age of Onset  . Cirrhosis Brother   . Diabetes Father   . Heart disease Father   . Stroke Father     Social History Social History   Tobacco Use  . Smoking status: Former Games developer  . Smokeless tobacco: Never Used  Substance Use Topics  . Alcohol use: Yes    Comment: Occasional   . Drug use: No    Review of Systems Constitutional: No fever/chills Eyes: No visual changes. ENT: No sore throat. Cardiovascular: Denies chest pain. Respiratory: Denies shortness of breath. Gastrointestinal: Positive for abdominal pain.  Positive for nausea, positive for vomiting.  No diarrhea.  No constipation. Genitourinary: Negative for dysuria. Musculoskeletal: Negative for back pain. Skin: Negative for rash. Neurological: Negative for headaches, focal weakness or numbness.   ____________________________________________   PHYSICAL EXAM:  VITAL SIGNS: ED Triage Vitals [04/15/17 1810]  Enc Vitals Group     BP (!) 163/110     Pulse Rate 74     Resp 18     Temp 98.7 F (37.1 C)     Temp Source Oral     SpO2 98 %     Weight 160 lb (72.6 kg)     Height 5\' 5"  (1.651 m)     Head Circumference      Peak Flow      Pain Score 8     Pain Loc      Pain Edu?      Excl. in GC?     Constitutional: Alert and oriented x4 appears  somewhat uncomfortable nontoxic no diaphoresis before clear sentences Eyes: PERRL EOMI. Head: Atraumatic. Nose: No congestion/rhinnorhea. Mouth/Throat: No trismus Neck: No stridor.   Cardiovascular: Normal rate, regular rhythm. Grossly normal heart sounds.  Good peripheral circulation. Respiratory: Normal respiratory effort.  No retractions. Lungs CTAB and moving good air Gastrointestinal: Soft abdomen mild diffuse upper abdominal discomfort with tenderness no focality no rebound no guarding no peritonitis Musculoskeletal: No lower extremity edema   Neurologic:  Normal speech and language. No gross focal neurologic deficits are appreciated. Skin:  Skin is warm, dry and intact. No rash noted. Psychiatric: Mood and affect are normal. Speech and behavior are normal.    ____________________________________________   DIFFERENTIAL includes but not limited to  Appendicitis, diverticulitis, internal hernia, bowel obstruction, perforation, C. difficile, infectious colitis ____________________________________________   LABS (all labs ordered are listed, but only abnormal results are displayed)  Labs Reviewed  CBC - Abnormal; Notable for the following components:      Result Value   RBC 5.29 (*)    Hemoglobin 11.1 (*)    MCV 66.7 (*)    MCH 21.0 (*)    MCHC 31.6 (*)    RDW 17.2 (*)    All other components within normal limits  URINALYSIS, COMPLETE (UACMP) WITH MICROSCOPIC - Abnormal; Notable for the following components:   Color, Urine AMBER (*)    APPearance CLOUDY (*)    Ketones, ur 5 (*)    Protein, ur 30 (*)    Squamous Epithelial / LPF TOO NUMEROUS TO COUNT (*)    All other components within normal limits  GASTROINTESTINAL PANEL BY PCR, STOOL (REPLACES STOOL CULTURE)  LIPASE, BLOOD  COMPREHENSIVE METABOLIC PANEL  LACTIC ACID, PLASMA    Blood work reviewed by me shows no acute disease.  Urinalysis with large amount of squamous cells makes interpretation nearly  impossible __________________________________________  EKG   ____________________________________________  RADIOLOGY  CT scan abdomen pelvis with oral and IV contrast with no acute disease noted ____________________________________________   PROCEDURES  Procedure(s) performed: no  Procedures  Critical Care performed: no  Observation: no ____________________________________________   INITIAL IMPRESSION / ASSESSMENT AND PLAN / ED COURSE  Pertinent labs & imaging results that were available during my care of the patient were reviewed by me and considered in my medical decision making (see chart for details).  The patient arrives quite uncomfortable appearing with diffuse upper abdominal discomfort nausea and diarrhea.  Differential in a patient with a gastric bypass is extremely broad but in addition to IV morphine and Zofran and fluids she requires a CT scan with IV and oral contrast.  Fortunately the patient's imaging is negative for acute pathology and her pain is nearly completely resolved.  I had a lengthy discussion with the patient and her husband at bedside regarding the diagnostic uncertainty and that while at this point she did not require antibiotics or surgery if her pain persisted for more than another day she required a revisit for reevaluation.  She verbalizes understanding and agree with the plan.  She is discharged home in improved condition.  She has no evidence of C. difficile at this time.      ____________________________________________   FINAL CLINICAL IMPRESSION(S) / ED DIAGNOSES  Final diagnoses:  Diarrhea, unspecified type  Nausea      NEW MEDICATIONS STARTED DURING THIS VISIT:  This SmartLink is deprecated. Use AVSMEDLIST instead to display the medication list for a patient.   Note:  This document was prepared using Dragon voice recognition software and may include unintentional dictation errors.     Merrily Brittleifenbark, Ladana Chavero, MD 04/16/17  [id number]

## 2017-04-15 NOTE — ED Triage Notes (Signed)
Pt states diarrhea for several days, states abd pain that developed yesterday, states hx of cdiff, pt awake and alert in no acute distress

## 2017-04-15 NOTE — ED Notes (Signed)
Pt reports that she has "c-diff" - pt reports that she has had c-diff before - pt reports diarrhea for 3-4 days with severe abd pain off and on for 2 days - (9-10 episodes of diarrhea in the last 24 hours) - reports nausea but denies vomiting

## 2017-04-23 ENCOUNTER — Encounter: Payer: Self-pay | Admitting: Gastroenterology

## 2017-04-23 ENCOUNTER — Ambulatory Visit: Payer: BC Managed Care – PPO | Admitting: Gastroenterology

## 2017-04-23 ENCOUNTER — Other Ambulatory Visit: Payer: Self-pay

## 2017-04-23 ENCOUNTER — Encounter: Payer: Self-pay | Admitting: *Deleted

## 2017-04-23 VITALS — BP 159/76 | HR 76 | Temp 98.2°F | Ht 66.0 in | Wt 163.2 lb

## 2017-04-23 DIAGNOSIS — R110 Nausea: Secondary | ICD-10-CM

## 2017-04-23 DIAGNOSIS — A0472 Enterocolitis due to Clostridium difficile, not specified as recurrent: Secondary | ICD-10-CM

## 2017-04-23 MED ORDER — SUCRALFATE 1 GM/10ML PO SUSP: 1.0000 g | Freq: Three times a day (TID) | ORAL | 3 refills | Status: AC

## 2017-04-23 MED ORDER — OMEPRAZOLE 40 MG PO CPDR: 40.0000 mg | DELAYED_RELEASE_CAPSULE | Freq: Every day | ORAL | 6 refills | Status: AC

## 2017-04-23 MED ORDER — PROMETHAZINE HCL 12.5 MG PO TABS: 12.5000 mg | ORAL_TABLET | Freq: Four times a day (QID) | ORAL | 2 refills | Status: DC | PRN

## 2017-04-23 NOTE — Progress Notes (Signed)
Primary Care Physician: Court Joyimbrook-Dillow, Karen M, PA-C  Primary Gastroenterologist:  Dr. Midge Miniumarren Chesnee Floren  Chief Complaint  Patient presents with  . Follow up ER    HPI: Abigail Kim is a 46 y.o. female here for nausea and abdominal pain. The patient reports that she was seen in the emergency room for diarrhea. The patient states that she had severe abdominal cramps and diarrhea and went to the hospital. The patient has a history of C. difficile colitis in the past. She reports that after being in the hospital she has not had a bowel movement in the last 8 days. She also denies any abdominal pain at the present time. The patient does have chronic nausea and it has gone from intermittent to sustained. The patient is status post gastric bypass and thinks that this may be a manifestation of an anastomotic ulcer. The patient has some intermittent rectal bleeding but states she had a colonoscopy earlier this year at another facility.  Current Outpatient Medications  Medication Sig Dispense Refill  . acidophilus (RISAQUAD) CAPS capsule Take 1 capsule by mouth 2 (two) times daily. 20 capsule 0  . albuterol (PROVENTIL HFA;VENTOLIN HFA) 108 (90 BASE) MCG/ACT inhaler Inhale into the lungs every 6 (six) hours as needed for wheezing or shortness of breath.    . benazepril (LOTENSIN) 10 MG tablet     . cholecalciferol (VITAMIN D) 1000 units tablet Take 1,000 Units by mouth daily.    . Cyanocobalamin (VITAMIN B-12) 1000 MCG SUBL Place 1 tablet under the tongue daily.    . cyclobenzaprine (FLEXERIL) 10 MG tablet Take by mouth 2 (two) times daily as needed.     . DULoxetine (CYMBALTA) 60 MG capsule Take 60 mg by mouth daily as needed.     . fluticasone (FLONASE) 50 MCG/ACT nasal spray     . gabapentin (NEURONTIN) 100 MG capsule Take 100-300 mg by mouth at bedtime as needed.    . IRON PO Take by mouth.    . ondansetron (ZOFRAN ODT) 4 MG disintegrating tablet Take 1 tablet (4 mg total) every 8 (eight)  hours as needed by mouth for nausea or vomiting. 20 tablet 0  . promethazine (PHENERGAN) 12.5 MG tablet Take 1 tablet (12.5 mg total) by mouth every 6 (six) hours as needed for nausea or vomiting. 30 tablet 2  . magnesium oxide (MAG-OX) 400 MG tablet Take 400 mg by mouth daily.    . Multiple Vitamins-Minerals (BARIATRIC MULTIVITAMINS/IRON PO) Take by mouth.    . NON FORMULARY CBD oil as needed    . omeprazole (PRILOSEC) 40 MG capsule Take 1 capsule (40 mg total) by mouth daily. 30 capsule 6  . oxyCODONE-acetaminophen (ROXICET) 5-325 MG tablet Take 1 tablet every 6 (six) hours as needed by mouth for severe pain. (Patient not taking: Reported on 04/23/2017) 5 tablet 0  . sucralfate (CARAFATE) 1 GM/10ML suspension Take 10 mLs (1 g total) by mouth 4 (four) times daily -  with meals and at bedtime. 420 mL 3   No current facility-administered medications for this visit.     Allergies as of 04/23/2017 - Review Complete 04/23/2017  Allergen Reaction Noted  . Penicillins Shortness Of Breath 04/23/2017  . Atorvastatin Other (See Comments) 08/19/2013  . Hydrocodone-acetaminophen Nausea Only 01/19/2016  . Ibuprofen  05/11/2015  . Amoxicillin Diarrhea and Nausea And Vomiting 09/25/2006  . Bupropion hcl Other (See Comments) 09/25/2006  . Eggs or egg-derived products Diarrhea 04/23/2017  . Metformin Diarrhea 09/25/2006  .  Oxycodone Nausea Only 03/25/2015  . Venlafaxine Other (See Comments) 09/25/2006  . Metformin and related Diarrhea 08/19/2013  . Tape Rash 01/22/2016    ROS:  General: Negative for anorexia, weight loss, fever, chills, fatigue, weakness. ENT: Negative for hoarseness, difficulty swallowing , nasal congestion. CV: Negative for chest pain, angina, palpitations, dyspnea on exertion, peripheral edema.  Respiratory: Negative for dyspnea at rest, dyspnea on exertion, cough, sputum, wheezing.  GI: See history of present illness. GU:  Negative for dysuria, hematuria, urinary incontinence,  urinary frequency, nocturnal urination.  Endo: Negative for unusual weight change.    Physical Examination:   BP (!) 159/76 (BP Location: Right Arm, Patient Position: Sitting, Cuff Size: Normal)   Pulse 76   Temp 98.2 F (36.8 C) (Oral)   Ht 5\' 6"  (1.676 m)   Wt 163 lb 3.2 oz (74 kg)   LMP 04/08/2017 Comment: Pt has continued to "spot" since ablation  BMI 26.34 kg/m   General: Well-nourished, well-developed in no acute distress.  Eyes: No icterus. Conjunctivae pink. Mouth: Oropharyngeal mucosa moist and pink , no lesions erythema or exudate. Lungs: Clear to auscultation bilaterally. Non-labored. Heart: Regular rate and rhythm, no murmurs rubs or gallops.  Abdomen: Bowel sounds are normal, nontender, nondistended, no hepatosplenomegaly or masses, no abdominal bruits or hernia , no rebound or guarding.   Extremities: No lower extremity edema. No clubbing or deformities. Neuro: Alert and oriented x 3.  Grossly intact. Skin: Warm and dry, no jaundice.   Psych: Alert and cooperative, normal mood and affect.  Labs:    Imaging Studies: Ct Abdomen Pelvis W Contrast  Result Date: 04/15/2017 CLINICAL DATA:  Abdominal distention EXAM: CT ABDOMEN AND PELVIS WITH CONTRAST TECHNIQUE: Multidetector CT imaging of the abdomen and pelvis was performed using the standard protocol following bolus administration of intravenous contrast. CONTRAST:  100mL ISOVUE-300 IOPAMIDOL (ISOVUE-300) INJECTION 61% COMPARISON:  CT abdomen pelvis 02/05/2016 FINDINGS: Lower chest: No pulmonary nodules or pleural effusion. No visible pericardial effusion. Hepatobiliary: Normal hepatic contours and density. No visible biliary dilatation. Status post cholecystectomy. Pancreas: Normal contours without ductal dilatation. No peripancreatic fluid collection. Spleen: Normal. Adrenals/Urinary Tract: --Adrenal glands: Normal. --Right kidney/ureter: No hydronephrosis or perinephric stranding. No nephrolithiasis. No obstructing  ureteral stones. --Left kidney/ureter: No hydronephrosis or perinephric stranding. No nephrolithiasis. No obstructing ureteral stones. --Urinary bladder: Unremarkable. Stomach/Bowel: --Stomach/Duodenum: Status post gastric bypass. Small sliding hiatal hernia, unchanged. --Small bowel: No dilatation or inflammation. --Colon: No focal abnormality. --Appendix: Normal. Vascular/Lymphatic: Atherosclerotic calcification is present within the non-aneurysmal abdominal aorta, without hemodynamically significant stenosis. No abdominal or pelvic lymphadenopathy. Reproductive: The uterus and left ovary are normal. There is a peripherally enhancing right ovarian foci, likely a resolving corpus luteum. Small amount of fluid in the posterior cul-de-sac is likely physiologic. Musculoskeletal. No bony spinal canal stenosis or focal osseous abnormality. Other: None. IMPRESSION: 1. No acute abdominopelvic abnormality. 2.  Aortic Atherosclerosis (ICD10-I70.0). Electronically Signed   By: Deatra RobinsonKevin  Herman M.D.   On: 04/15/2017 22:27    Assessment and Plan:   Abigail Kim is a 46 y.o. y/o female with chronic nausea and a history of diarrhea that has now resolved. The patient had not had a bowel movement in the last 8 days. The patient has been told to start taking MiraLAX. The patient will be set up for an EGD to rule out peptic ulcer disease. She will also be started on omeprazole and Carafate. The patient has been explained the plan and agrees with it.  Lucilla Lame, MD. Marval Regal   Note: This dictation was prepared with Dragon dictation along with smaller phrase technology. Any transcriptional errors that result from this process are unintentional.

## 2017-04-25 NOTE — Discharge Instructions (Signed)
General Anesthesia, Adult, Care After °These instructions provide you with information about caring for yourself after your procedure. Your health care provider may also give you more specific instructions. Your treatment has been planned according to current medical practices, but problems sometimes occur. Call your health care provider if you have any problems or questions after your procedure. °What can I expect after the procedure? °After the procedure, it is common to have: °· Vomiting. °· A sore throat. °· Mental slowness. ° °It is common to feel: °· Nauseous. °· Cold or shivery. °· Sleepy. °· Tired. °· Sore or achy, even in parts of your body where you did not have surgery. ° °Follow these instructions at home: °For at least 24 hours after the procedure: °· Do not: °? Participate in activities where you could fall or become injured. °? Drive. °? Use heavy machinery. °? Drink alcohol. °? Take sleeping pills or medicines that cause drowsiness. °? Make important decisions or sign legal documents. °? Take care of children on your own. °· Rest. °Eating and drinking °· If you vomit, drink water, juice, or soup when you can drink without vomiting. °· Drink enough fluid to keep your urine clear or pale yellow. °· Make sure you have little or no nausea before eating solid foods. °· Follow the diet recommended by your health care provider. °General instructions °· Have a responsible adult stay with you until you are awake and alert. °· Return to your normal activities as told by your health care provider. Ask your health care provider what activities are safe for you. °· Take over-the-counter and prescription medicines only as told by your health care provider. °· If you smoke, do not smoke without supervision. °· Keep all follow-up visits as told by your health care provider. This is important. °Contact a health care provider if: °· You continue to have nausea or vomiting at home, and medicines are not helpful. °· You  cannot drink fluids or start eating again. °· You cannot urinate after 8-12 hours. °· You develop a skin rash. °· You have fever. °· You have increasing redness at the site of your procedure. °Get help right away if: °· You have difficulty breathing. °· You have chest pain. °· You have unexpected bleeding. °· You feel that you are having a life-threatening or urgent problem. °This information is not intended to replace advice given to you by your health care provider. Make sure you discuss any questions you have with your health care provider. °Document Released: 08/20/2000 Document Revised: 10/17/2015 Document Reviewed: 04/28/2015 °Elsevier Interactive Patient Education © 2018 [employer] ° °

## 2017-04-26 ENCOUNTER — Ambulatory Visit: Payer: BC Managed Care – PPO | Admitting: Anesthesiology

## 2017-04-26 ENCOUNTER — Encounter: Payer: Self-pay | Admitting: *Deleted

## 2017-04-26 ENCOUNTER — Ambulatory Visit
Admission: RE | Admit: 2017-04-26 | Discharge: 2017-04-26 | Disposition: A | Discharge status: Home / Self Care | Payer: BC Managed Care – PPO | Source: Ambulatory Visit | Attending: Gastroenterology | Admitting: Gastroenterology

## 2017-04-26 ENCOUNTER — Encounter
Admission: RE | Disposition: A | Discharge status: Home / Self Care | Payer: Self-pay | Source: Ambulatory Visit | Attending: Gastroenterology

## 2017-04-26 DIAGNOSIS — E559 Vitamin D deficiency, unspecified: Secondary | ICD-10-CM | POA: Insufficient documentation

## 2017-04-26 DIAGNOSIS — Z9884 Bariatric surgery status: Secondary | ICD-10-CM

## 2017-04-26 DIAGNOSIS — Z79899 Other long term (current) drug therapy: Secondary | ICD-10-CM | POA: Insufficient documentation

## 2017-04-26 DIAGNOSIS — Z87891 Personal history of nicotine dependence: Secondary | ICD-10-CM | POA: Diagnosis not present

## 2017-04-26 DIAGNOSIS — M419 Scoliosis, unspecified: Secondary | ICD-10-CM | POA: Diagnosis not present

## 2017-04-26 DIAGNOSIS — M797 Fibromyalgia: Secondary | ICD-10-CM | POA: Insufficient documentation

## 2017-04-26 DIAGNOSIS — Z7984 Long term (current) use of oral hypoglycemic drugs: Secondary | ICD-10-CM | POA: Diagnosis not present

## 2017-04-26 DIAGNOSIS — K449 Diaphragmatic hernia without obstruction or gangrene: Secondary | ICD-10-CM | POA: Diagnosis not present

## 2017-04-26 DIAGNOSIS — E1140 Type 2 diabetes mellitus with diabetic neuropathy, unspecified: Secondary | ICD-10-CM | POA: Diagnosis not present

## 2017-04-26 DIAGNOSIS — R1013 Epigastric pain: Secondary | ICD-10-CM | POA: Diagnosis not present

## 2017-04-26 DIAGNOSIS — F329 Major depressive disorder, single episode, unspecified: Secondary | ICD-10-CM | POA: Diagnosis not present

## 2017-04-26 DIAGNOSIS — I10 Essential (primary) hypertension: Secondary | ICD-10-CM | POA: Diagnosis not present

## 2017-04-26 DIAGNOSIS — Z980 Intestinal bypass and anastomosis status: Secondary | ICD-10-CM | POA: Diagnosis not present

## 2017-04-26 DIAGNOSIS — E7800 Pure hypercholesterolemia, unspecified: Secondary | ICD-10-CM | POA: Insufficient documentation

## 2017-04-26 DIAGNOSIS — E063 Autoimmune thyroiditis: Secondary | ICD-10-CM | POA: Insufficient documentation

## 2017-04-26 HISTORY — DX: Unspecified osteoarthritis, unspecified site: M19.90

## 2017-04-26 HISTORY — PX: ESOPHAGOGASTRODUODENOSCOPY (EGD) WITH PROPOFOL: SHX5813

## 2017-04-26 HISTORY — DX: Presence of spectacles and contact lenses: Z97.3

## 2017-04-26 HISTORY — PX: FOREIGN BODY REMOVAL: SHX962

## 2017-04-26 HISTORY — DX: Follicular cyst of ovary, unspecified side: N83.00

## 2017-04-26 HISTORY — DX: Polyneuropathy, unspecified: G62.9

## 2017-04-26 HISTORY — DX: Dorsopathy, unspecified: M53.9

## 2017-04-26 HISTORY — DX: Anemia, unspecified: D64.9

## 2017-04-26 HISTORY — DX: Cardiac murmur, unspecified: R01.1

## 2017-04-26 SURGERY — ESOPHAGOGASTRODUODENOSCOPY (EGD) WITH PROPOFOL
Anesthesia: General | Wound class: Clean Contaminated

## 2017-04-26 MED ORDER — LIDOCAINE HCL (CARDIAC) 20 MG/ML IV SOLN
INTRAVENOUS | Status: DC | PRN
  Administered 2017-04-26: 40 mg via INTRAVENOUS

## 2017-04-26 MED ORDER — GLYCOPYRROLATE 0.2 MG/ML IJ SOLN
INTRAMUSCULAR | Status: DC | PRN
  Administered 2017-04-26: 0.2 mg via INTRAVENOUS

## 2017-04-26 MED ORDER — PROPOFOL 10 MG/ML IV BOLUS
INTRAVENOUS | Status: DC | PRN
  Administered 2017-04-26: 50 mg via INTRAVENOUS
  Administered 2017-04-26: 20 mg via INTRAVENOUS
  Administered 2017-04-26: 100 mg via INTRAVENOUS
  Administered 2017-04-26: 30 mg via INTRAVENOUS

## 2017-04-26 MED ORDER — LACTATED RINGERS IV SOLN
INTRAVENOUS | Status: DC
  Administered 2017-04-26: 09:00:00 via INTRAVENOUS

## 2017-04-26 SURGICAL SUPPLY — 32 items
BALLN DILATOR 10-12 8 (BALLOONS)
BALLN DILATOR 12-15 8 (BALLOONS)
BALLN DILATOR 15-18 8 (BALLOONS)
BALLN DILATOR CRE 0-12 8 (BALLOONS)
BALLN DILATOR ESOPH 8 10 CRE (MISCELLANEOUS) IMPLANT
BALLOON DILATOR 12-15 8 (BALLOONS) IMPLANT
BALLOON DILATOR 15-18 8 (BALLOONS) IMPLANT
BALLOON DILATOR CRE 0-12 8 (BALLOONS) IMPLANT
BLOCK BITE 60FR ADLT L/F GRN (MISCELLANEOUS) ×3 IMPLANT
CANISTER SUCT 1200ML W/VALVE (MISCELLANEOUS) ×3 IMPLANT
CLIP HMST 235XBRD CATH ROT (MISCELLANEOUS) IMPLANT
CLIP RESOLUTION 360 11X235 (MISCELLANEOUS)
FCP ESCP3.2XJMB 240X2.8X (MISCELLANEOUS) ×2
FORCEPS BIOP RAD 4 LRG CAP 4 (CUTTING FORCEPS) IMPLANT
FORCEPS BIOP RJ4 240 W/NDL (MISCELLANEOUS) ×3
FORCEPS ESCP3.2XJMB 240X2.8X (MISCELLANEOUS) IMPLANT
GOWN CVR UNV OPN BCK APRN NK (MISCELLANEOUS) ×4 IMPLANT
GOWN ISOL THUMB LOOP REG UNIV (MISCELLANEOUS) ×6
INJECTOR VARIJECT VIN23 (MISCELLANEOUS) IMPLANT
KIT DEFENDO VALVE AND CONN (KITS) IMPLANT
KIT ENDO PROCEDURE OLY (KITS) ×3 IMPLANT
MARKER SPOT ENDO TATTOO 5ML (MISCELLANEOUS) IMPLANT
PAD GROUND ADULT SPLIT (MISCELLANEOUS) IMPLANT
RETRIEVER NET PLAT FOOD (MISCELLANEOUS) IMPLANT
SNARE SHORT THROW 13M SML OVAL (MISCELLANEOUS) IMPLANT
SNARE SHORT THROW 30M LRG OVAL (MISCELLANEOUS) IMPLANT
SPOT EX ENDOSCOPIC TATTOO (MISCELLANEOUS)
SYR INFLATION 60ML (SYRINGE) IMPLANT
TRAP ETRAP POLY (MISCELLANEOUS) IMPLANT
VARIJECT INJECTOR VIN23 (MISCELLANEOUS)
WATER STERILE IRR 250ML POUR (IV SOLUTION) ×3 IMPLANT
WIRE CRE 18-20MM 8CM F G (MISCELLANEOUS) IMPLANT

## 2017-04-26 NOTE — Anesthesia Preprocedure Evaluation (Signed)
Anesthesia Evaluation  Patient identified by MRN, date of birth, ID band Patient awake    History of Anesthesia Complications Negative for: history of anesthetic complications  Airway Mallampati: I  TM Distance: >3 FB Neck ROM: Full    Dental  (+)    Pulmonary asthma , former smoker (quit 2003),    Pulmonary exam normal breath sounds clear to auscultation       Cardiovascular Exercise Tolerance: Good hypertension, + CAD  + Valvular Problems/Murmurs (murmur)  Rhythm:Regular Rate:Normal  ECG 02/04/16: normal   Neuro/Psych PSYCHIATRIC DISORDERS (PTSD) Anxiety Depression negative neurological ROS     GI/Hepatic S/p gastric bypass   Endo/Other  diabetes, Well Controlled  Renal/GU negative Renal ROS     Musculoskeletal   Abdominal   Peds  Hematology  (+) Blood dyscrasia, anemia ,   Anesthesia Other Findings   Reproductive/Obstetrics                             Anesthesia Physical Anesthesia Plan  ASA: III  Anesthesia Plan: General   Post-op Pain Management:    Induction: Intravenous  PONV Risk Score and Plan: 1 and Propofol infusion  Airway Management Planned: Natural Airway  Additional Equipment:   Intra-op Plan:   Post-operative Plan:   Informed Consent: I have reviewed the patients History and Physical, chart, labs and discussed the procedure including the risks, benefits and alternatives for the proposed anesthesia with the patient or authorized representative who has indicated his/her understanding and acceptance.     Plan Discussed with: CRNA  Anesthesia Plan Comments:         Anesthesia Quick Evaluation

## 2017-04-26 NOTE — Anesthesia Procedure Notes (Signed)
Procedure Name: MAC Date/Time: 04/26/2017 9:55 AM Performed by: Janna Arch, CRNA Pre-anesthesia Checklist: Patient identified, Emergency Drugs available, Suction available and Patient being monitored Patient Re-evaluated:Patient Re-evaluated prior to induction Oxygen Delivery Method: Nasal cannula

## 2017-04-26 NOTE — Op Note (Addendum)
[facility] Gastroenterology Patient Name: Abigail Kim Procedure Date: 04/26/2017 9:49 AM MRN: [id number] Account #: [id number] Date of Birth: 11-12-1970 Admit Type: Outpatient Age: 46 [facility] Gender: Female Note Status: Supervisor Override Procedure:            Upper GI endoscopy Indications:          Epigastric abdominal pain Providers:            Midge Miniumarren Erika Slaby MD, MD Referring MD:         Loleta DickerErin Judge (Referring MD) Medicines:            Propofol per Anesthesia Complications:        No immediate complications. Procedure:            Pre-Anesthesia Assessment:                       - Prior to the procedure, a History and Physical was                        performed, and patient medications and allergies were                        reviewed. The patient's tolerance of previous                        anesthesia was also reviewed. The risks and benefits of                        the procedure and the sedation options and risks were                        discussed with the patient. All questions were                        answered, and informed consent was obtained. Prior                        Anticoagulants: The patient has taken no previous                        anticoagulant or antiplatelet agents. [name]                        Assessment: II - A patient with mild systemic disease.                        After reviewing the risks and benefits, the patient was                        deemed in satisfactory condition to undergo the                        procedure.                       After obtaining informed consent, the endoscope was                        passed under direct vision. Throughout the procedure,  the patient's blood pressure, pulse, and oxygen                        saturations were monitored continuously. The Olympus                        [id number]-HQ190 Endoscope (S#. [id number]) was introduced         through the mouth, and advanced to the jejunum. The                        upper GI endoscopy was accomplished without difficulty.                        The patient tolerated the procedure well. Findings:      A small hiatal hernia was present.      Evidence of a gastric bypass was found. A gastric pouch with a normal       size was found. The gastrojejunal anastomosis was characterized by an       intact staple line. This was traversed. The pouch-to-jejunum limb was       characterized by healthy appearing mucosa. Removal of a staple was       accomplished with a regular forceps.      The examined jejunum was normal. Impression:           - Small hiatal hernia.                       - Gastric bypass with a normal-sized pouch.                        Gastrojejunal anastomosis characterized by an intact                        staple line. Staple seen and removed.                       - Normal examined jejunum. Recommendation:       - Discharge patient to home.                       - Resume previous diet.                       - Continue present medications. Procedure Code(s):    --- Professional ---                       [phone], Esophagogastroduodenoscopy, flexible, transoral;                        with removal of foreign body(s) Diagnosis Code(s):    --- Professional ---                       R10.13, Epigastric pain                       Z98.84, Bariatric surgery status CPT copyright 2018 American Medical Association. All rights reserved. The codes documented in this report are preliminary and upon coder review may  be revised to meet current compliance requirements. Midge Miniumarren Djeneba Barsch MD, MD 04/26/2017 10:07:15 AM This report has been signed electronically. Number of Addenda: 0  Note Initiated On: 04/26/2017 9:49 AM      [facility]

## 2017-04-26 NOTE — Transfer of Care (Signed)
Immediate Anesthesia Transfer of Care Note  Patient: Abigail Kim  Procedure(s) Performed: ESOPHAGOGASTRODUODENOSCOPY (EGD) WITH PROPOFOL (N/A )  Patient Location: PACU  Anesthesia Type: General  Level of Consciousness: awake, alert  and patient cooperative  Airway and Oxygen Therapy: Patient Spontanous Breathing and Patient connected to supplemental oxygen  Post-op Assessment: Post-op Vital signs reviewed, Patient's Cardiovascular Status Stable, Respiratory Function Stable, Patent Airway and No signs of Nausea or vomiting  Post-op Vital Signs: Reviewed and stable  Complications: No apparent anesthesia complications

## 2017-04-26 NOTE — H&P (Signed)
Midge Miniumarren Nichalos Brenton, MD [facility] [address] [place], [place] [address] Phone:[phone] Fax : [phone]  Primary Care Physician:  Loleta DickerJudge, Erin, FNP Primary Gastroenterologist:  Dr. Servando SnareWohl  Pre-Procedure History & Physical: HPI:  Abigail Kim is a 46 y.o. female is here for an endoscopy.   Past Medical History:  Diagnosis Date  . Addison's disease (HCC)   . Anemia   . Arthritis   . Diabetes mellitus without complication (HCC)    diet controlled since bariatric surgery  . Fibromyalgia   . Follicular cyst of ovary   . Hashimoto's disease    prior dx, no complications/symptoms  . Heart murmur    "benign flow murmur"  . Hypercholesteremia   . Hypertension   . Major depression   . Multilevel degenerative disc disease   . Peripheral neuropathy    R/T DM, legs  . Scoliosis   . Vitamin D deficiency   . Wears contact lenses     Past Surgical History:  Procedure Laterality Date  . CHOLECYSTECTOMY    . ENDOMETRIAL ABLATION  2017  . GASTRIC BYPASS  02/07/2015    Prior to Admission medications   Medication Sig Start Date End Date Taking? Authorizing Provider  acidophilus (RISAQUAD) CAPS capsule Take 1 capsule by mouth 2 (two) times daily. 02/09/16  Yes Enid BaasKalisetti, Radhika, MD  albuterol (PROVENTIL HFA;VENTOLIN HFA) 108 (90 BASE) MCG/ACT inhaler Inhale into the lungs every 6 (six) hours as needed for wheezing or shortness of breath.   Yes [provider]  benazepril (LOTENSIN) 10 MG tablet  02/18/16  Yes [provider]  cholecalciferol (VITAMIN D) 1000 units tablet Take 1,000 Units by mouth daily.   Yes [provider]  Cyanocobalamin (VITAMIN B-12) 1000 MCG SUBL Place 1 tablet under the tongue daily.   Yes [provider]  cyclobenzaprine (FLEXERIL) 10 MG tablet Take by mouth 2 (two) times daily as needed.  01/03/17 01/03/18 Yes [provider]  DULoxetine (CYMBALTA) 60 MG capsule Take 60 mg by mouth daily as needed.    Yes  [provider]  fluticasone Aleda Grana(FLONASE) 50 MCG/ACT nasal spray  10/30/16  Yes [provider]  gabapentin (NEURONTIN) 100 MG capsule Take 100-300 mg by mouth at bedtime as needed.   Yes [provider]  IRON PO Take by mouth.   Yes [provider]  Multiple Vitamins-Minerals (BARIATRIC MULTIVITAMINS/IRON PO) Take by mouth.   Yes [provider]  NON FORMULARY CBD oil as needed   Yes [provider]  omeprazole (PRILOSEC) 40 MG capsule Take 1 capsule (40 mg total) by mouth daily. 04/23/17  Yes Midge MiniumWohl, Roxine Whittinghill, MD  ondansetron (ZOFRAN ODT) 4 MG disintegrating tablet Take 1 tablet (4 mg total) every 8 (eight) hours as needed by mouth for nausea or vomiting. 04/15/17  Yes Merrily Brittleifenbark, Neil, MD  promethazine (PHENERGAN) 12.5 MG tablet Take 1 tablet (12.5 mg total) by mouth every 6 (six) hours as needed for nausea or vomiting. 04/23/17  Yes Midge MiniumWohl, Trai Ells, MD  sucralfate (CARAFATE) 1 GM/10ML suspension Take 10 mLs (1 g total) by mouth 4 (four) times daily -  with meals and at bedtime. 04/23/17  Yes Midge MiniumWohl, Addylynn Balin, MD  magnesium oxide (MAG-OX) 400 MG tablet Take 400 mg by mouth daily.    [provider]  oxyCODONE-acetaminophen (ROXICET) 5-325 MG tablet Take 1 tablet every 6 (six) hours as needed by mouth for severe pain. Patient not taking: Reported on 04/23/2017 04/15/17   Merrily Brittleifenbark, Neil, MD  Allergies as of 04/23/2017 - Review Complete 04/23/2017  Allergen Reaction Noted  . Penicillins Shortness Of Breath 04/23/2017  . Atorvastatin Other (See Comments) 08/19/2013  . Hydrocodone-acetaminophen Nausea Only 01/19/2016  . Ibuprofen  05/11/2015  . Amoxicillin Diarrhea and Nausea And Vomiting 09/25/2006  . Bupropion hcl Other (See Comments) 09/25/2006  . Eggs or egg-derived products Diarrhea 04/23/2017  . Metformin Diarrhea 09/25/2006  . Oxycodone Nausea Only 03/25/2015  . Venlafaxine Other (See Comments) 09/25/2006  . Metformin and related  Diarrhea 08/19/2013  . Tape Rash 01/22/2016    Family History  Problem Relation Age of Onset  . Cirrhosis Brother   . Diabetes Father   . Heart disease Father   . Stroke Father     Social History   Socioeconomic History  . Marital status: Married    Spouse name: Not on file  . Number of children: Not on file  . Years of education: Not on file  . Highest education level: Not on file  Social Needs  . Financial resource strain: Not on file  . Food insecurity - worry: Not on file  . Food insecurity - inability: Not on file  . Transportation needs - medical: Not on file  . Transportation needs - non-medical: Not on file  Occupational History  . Not on file  Tobacco Use  . Smoking status: Former Smoker    Types: Cigarettes    Last attempt to quit: 08/10/2001    Years since quitting: 15.7  . Smokeless tobacco: Never Used  Substance and Sexual Activity  . Alcohol use: Yes    Comment: Occasional - Holidays  . Drug use: No  . Sexual activity: Not on file  Other Topics Concern  . Not on file  Social History Narrative  . Not on file    Review of Systems: See HPI, otherwise negative ROS  Physical Exam: Ht 5\' 5"  (1.651 m)   Wt 163 lb (73.9 kg)   LMP 04/24/2017 Comment: Pt has continued to "spot" since ablation  BMI 27.12 kg/m  General:   Alert,  pleasant and cooperative in NAD Head:  Normocephalic and atraumatic. Neck:  Supple; no masses or thyromegaly. Lungs:  Clear throughout to auscultation.    Heart:  Regular rate and rhythm. Abdomen:  Soft, nontender and nondistended. Normal bowel sounds, without guarding, and without rebound.   Neurologic:  Alert and  oriented x4;  grossly normal neurologically.  Impression/Plan: Abigail JaegerGloria P Cashin is here for an endoscopy to be performed for epigastric pain  Risks, benefits, limitations, and alternatives regarding  endoscopy have been reviewed with the patient.  Questions have been answered.  All parties agreeable.   Midge Miniumarren  Marguarite Markov, MD  04/26/2017, 8:44 AM

## 2017-04-26 NOTE — Anesthesia Postprocedure Evaluation (Signed)
Anesthesia Post Note  Patient: Abigail Kim  Procedure(s) Performed: ESOPHAGOGASTRODUODENOSCOPY (EGD) WITH PROPOFOL (N/A ) FOREIGN BODY REMOVAL  Patient location during evaluation: PACU Anesthesia Type: General Level of consciousness: awake and alert, oriented and patient cooperative Pain management: pain level controlled Vital Signs Assessment: post-procedure vital signs reviewed and stable Respiratory status: spontaneous breathing, nonlabored ventilation and respiratory function stable Cardiovascular status: blood pressure returned to baseline and stable Postop Assessment: adequate PO intake Anesthetic complications: no    Reed BreechAndrea Jen Benedict

## 2017-09-10 ENCOUNTER — Emergency Department
Admission: EM | Admit: 2017-09-10 | Discharge: 2017-09-10 | Disposition: A | Discharge status: Home / Self Care | Payer: Self-pay | Attending: Emergency Medicine | Admitting: Emergency Medicine

## 2017-09-10 ENCOUNTER — Encounter: Payer: Self-pay | Admitting: Emergency Medicine

## 2017-09-10 ENCOUNTER — Emergency Department: Payer: Self-pay

## 2017-09-10 ENCOUNTER — Other Ambulatory Visit: Payer: Self-pay

## 2017-09-10 DIAGNOSIS — I10 Essential (primary) hypertension: Secondary | ICD-10-CM | POA: Insufficient documentation

## 2017-09-10 DIAGNOSIS — J45909 Unspecified asthma, uncomplicated: Secondary | ICD-10-CM | POA: Insufficient documentation

## 2017-09-10 DIAGNOSIS — S9031XA Contusion of right foot, initial encounter: Secondary | ICD-10-CM | POA: Insufficient documentation

## 2017-09-10 DIAGNOSIS — Y929 Unspecified place or not applicable: Secondary | ICD-10-CM | POA: Insufficient documentation

## 2017-09-10 DIAGNOSIS — Y9389 Activity, other specified: Secondary | ICD-10-CM | POA: Insufficient documentation

## 2017-09-10 DIAGNOSIS — E785 Hyperlipidemia, unspecified: Secondary | ICD-10-CM | POA: Insufficient documentation

## 2017-09-10 DIAGNOSIS — Y998 Other external cause status: Secondary | ICD-10-CM | POA: Insufficient documentation

## 2017-09-10 DIAGNOSIS — Z87891 Personal history of nicotine dependence: Secondary | ICD-10-CM | POA: Insufficient documentation

## 2017-09-10 DIAGNOSIS — Z79899 Other long term (current) drug therapy: Secondary | ICD-10-CM | POA: Insufficient documentation

## 2017-09-10 DIAGNOSIS — W208XXA Other cause of strike by thrown, projected or falling object, initial encounter: Secondary | ICD-10-CM | POA: Insufficient documentation

## 2017-09-10 DIAGNOSIS — E1140 Type 2 diabetes mellitus with diabetic neuropathy, unspecified: Secondary | ICD-10-CM | POA: Insufficient documentation

## 2017-09-10 DIAGNOSIS — E7800 Pure hypercholesterolemia, unspecified: Secondary | ICD-10-CM | POA: Insufficient documentation

## 2017-09-10 MED ORDER — TRAMADOL HCL 50 MG PO TABS: 50.0000 mg | ORAL_TABLET | Freq: Four times a day (QID) | ORAL | 0 refills | Status: AC | PRN

## 2017-09-10 MED ORDER — PREDNISONE 50 MG PO TABS: 50.0000 mg | ORAL_TABLET | Freq: Every day | ORAL | 0 refills | Status: AC

## 2017-09-10 NOTE — ED Triage Notes (Signed)
Pt reports drops brass claw foot on right foot on Friday; c/o persistent pain since

## 2017-09-10 NOTE — ED Notes (Signed)
Pt c/o right foot pain d/t dropping a claw foot tub on her foot last Friday. Pt reports her granddaughter stepped on her right foot yesterday and the pain has not gone away. Pt has bruising to the top of this foot.

## 2017-09-10 NOTE — ED Provider Notes (Signed)
[facility] Emergency Department Provider Note  ____________________________________________  Time seen: Approximately 9:24 PM  I have reviewed the triage vital signs and the nursing notes.   HISTORY  Chief Complaint Foot Pain    HPI Abigail Kim is a 47 y.o. female who presents the emergency department complaining of foot pain.  Patient reports that she accidentally dropped a clawfoot tub on her right foot 5 days ago.  Patient reports that initially she thought it was just bruised.  Last night, her granddaughter accidentally stepped on the foot increasing her pain.  She has not been able to resolve pain at this time.  Patient was concerned that she may have fractured her foot.  She is able to ambulate but doing so increases pain.  She denies any other injury or complaint.  She is unable to take NSAIDs due to previous bariatric surgery.  No other complaints at this time.  Past Medical History:  Diagnosis Date  . Addison's disease (HCC)   . Anemia   . Arthritis   . Diabetes mellitus without complication (HCC)    diet controlled since bariatric surgery  . Fibromyalgia   . Follicular cyst of ovary   . Hashimoto's disease    prior dx, no complications/symptoms  . Heart murmur    "benign flow murmur"  . Hypercholesteremia   . Hypertension   . Major depression   . Multilevel degenerative disc disease   . Peripheral neuropathy    R/T DM, legs  . Scoliosis   . Vitamin D deficiency   . Wears contact lenses     Patient Active Problem List   Diagnosis Date Noted  . Abdominal pain, epigastric   . Bariatric surgery status   . Anemia 02/14/2017  . History of diabetes mellitus 02/12/2017  . GERD (gastroesophageal reflux disease) 02/04/2016  . C. difficile colitis 02/04/2016  . S/P cholecystectomy 01/23/2016  . 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 Chronic back pain 11/10/2014  . Fibromyalgia 11/10/2014  . Diabetes mellitus type 2 in obese  (HCC) 10/28/2014  . Family history of early CAD 10/26/2014  . FH: CAD (coronary artery disease) 10/21/2014  . Chronic recurrent major depressive disorder (HCC) 05/18/2014  . Hidradenitis suppurativa 02/10/2014  . Class 1 obesity 08/19/2013  . Addison disease (HCC) 03/30/2013  . Hashimoto's thyroiditis 03/30/2013  . Addison's disease (HCC) 03/30/2013  . DYSURIA 08/17/2008  . UNSPECIFIED VITAMIN D DEFICIENCY 06/22/2008  . NECK PAIN 05/27/2008  . DISTURBANCE OF SKIN SENSATION 05/27/2008  . FATIGUE 09/09/2007  . Lymphocytic thyroiditis 09/25/2006  . Diabetes (HCC) 09/25/2006  . HLD (hyperlipidemia) 09/25/2006  . Anxiety 09/25/2006  . PTSD 09/25/2006  . DEPRESSION 09/25/2006  . Essential hypertension 09/25/2006  . ALLERGIC RHINITIS 09/25/2006  . REACTIVE AIRWAY DISEASE 09/25/2006  . IRREGULAR MENSES 09/25/2006  . MURMUR 09/25/2006  . CERVICAL LYMPHADENOPATHY 09/25/2006  . PROTEINURIA 09/25/2006  . TOBACCO USE, QUIT 09/25/2006  . Asthma 09/25/2006  . Anxiety state 09/25/2006  . Depression 09/25/2006  . Type 2 diabetes mellitus (HCC) 09/25/2006  . Hyperlipidemia 09/25/2006  . Thyroiditis 09/25/2006    Past Surgical History:  Procedure Laterality Date  . CHOLECYSTECTOMY    . ENDOMETRIAL ABLATION  2017  . ESOPHAGOGASTRODUODENOSCOPY (EGD) WITH PROPOFOL N/A 04/26/2017   Procedure: ESOPHAGOGASTRODUODENOSCOPY (EGD) WITH PROPOFOL;  Surgeon: Midge Minium, MD;  Location: [facility];  Service: Endoscopy;  Laterality: N/A;  . FOREIGN BODY REMOVAL  04/26/2017   Procedure: FOREIGN BODY REMOVAL;  Surgeon: Midge Minium,  MD;  Location: [facility];  Service: Endoscopy;;  . GASTRIC BYPASS  02/07/2015    Prior to Admission medications   Medication Sig Start Date End Date Taking? Authorizing Provider  acidophilus (RISAQUAD) CAPS capsule Take 1 capsule by mouth 2 (two) times daily. 02/09/16   Enid Baas, MD  albuterol (PROVENTIL HFA;VENTOLIN HFA) 108 (90 BASE) MCG/ACT  inhaler Inhale into the lungs every 6 (six) hours as needed for wheezing or shortness of breath.    [provider]  benazepril (LOTENSIN) 10 MG tablet  02/18/16   [provider]  cholecalciferol (VITAMIN D) 1000 units tablet Take 1,000 Units by mouth daily.    [provider]  Cyanocobalamin (VITAMIN B-12) 1000 MCG SUBL Place 1 tablet under the tongue daily.    [provider]  cyclobenzaprine (FLEXERIL) 10 MG tablet Take by mouth 2 (two) times daily as needed.  01/03/17 01/03/18  [provider]  DULoxetine (CYMBALTA) 60 MG capsule Take 60 mg by mouth daily as needed.     [provider]  fluticasone Aleda Grana) 50 MCG/ACT nasal spray  10/30/16   [provider]  gabapentin (NEURONTIN) 100 MG capsule Take 100-300 mg by mouth at bedtime as needed.    [provider]  IRON PO Take by mouth.    [provider]  magnesium oxide (MAG-OX) 400 MG tablet Take 400 mg by mouth daily.    [provider]  Multiple Vitamins-Minerals (BARIATRIC MULTIVITAMINS/IRON PO) Take by mouth.    [provider]  NON FORMULARY CBD oil as needed    [provider]  omeprazole (PRILOSEC) 40 MG capsule Take 1 capsule (40 mg total) by mouth daily. 04/23/17   Midge Minium, MD  ondansetron (ZOFRAN ODT) 4 MG disintegrating tablet Take 1 tablet (4 mg total) every 8 (eight) hours as needed by mouth for nausea or vomiting. 04/15/17   Merrily Brittle, MD  oxyCODONE-acetaminophen (ROXICET) 5-325 MG tablet Take 1 tablet every 6 (six) hours as needed by mouth for severe pain. Patient not taking: Reported on 04/23/2017 04/15/17   Merrily Brittle, MD  predniSONE (DELTASONE) 50 MG tablet Take 1 tablet (50 mg total) by mouth daily with breakfast. 09/10/17   Nihal Doan, Delorise Royals, PA-C  promethazine (PHENERGAN) 12.5 MG tablet Take 1 tablet (12.5 mg total) by mouth every 6 (six) hours as needed for nausea or vomiting. 04/23/17   Midge Minium, MD   sucralfate (CARAFATE) 1 GM/10ML suspension Take 10 mLs (1 g total) by mouth 4 (four) times daily -  with meals and at bedtime. 04/23/17   Midge Minium, MD  traMADol (ULTRAM) 50 MG tablet Take 1 tablet (50 mg total) by mouth every 6 (six) hours as needed. 09/10/17   Linsay Vogt, Delorise Royals, PA-C    Allergies Penicillins; Atorvastatin; Hydrocodone-acetaminophen; Ibuprofen; Amoxicillin; Bupropion hcl; Eggs or egg-derived products; Metformin; Oxycodone; Venlafaxine; Metformin and related; and Tape  Family History  Problem Relation Age of Onset  . Cirrhosis Brother   . Diabetes Father   . Heart disease Father   . Stroke Father     Social History Social History   Tobacco Use  . Smoking status: Former Smoker    Types: Cigarettes    Last attempt to quit: 08/10/2001    Years since quitting: 16.0  . Smokeless tobacco: Never Used  Substance Use Topics  . Alcohol use: Yes    Comment: Occasional - Holidays  . Drug use: No     Review of Systems  Constitutional: No  fever/chills Eyes: No visual changes.  Cardiovascular: no chest pain. Respiratory: no cough. No SOB. Gastrointestinal: No abdominal pain.  No nausea, no vomiting.  Musculoskeletal: Positive for right foot injury and pain Skin: Negative for rash, abrasions, lacerations, ecchymosis. Neurological: Negative for headaches, focal weakness or numbness. 10-point ROS otherwise negative.  ____________________________________________   PHYSICAL EXAM:  VITAL SIGNS: ED Triage Vitals  Enc Vitals Group     BP 09/10/17 2014 (!) 175/100     Pulse Rate 09/10/17 2014 65     Resp 09/10/17 2014 17     Temp 09/10/17 2014 99 F (37.2 C)     Temp Source 09/10/17 2014 Oral     SpO2 09/10/17 2014 100 %     Weight 09/10/17 2016 160 lb (72.6 kg)     Height 09/10/17 2016 5\' 5"  (1.651 m)     Head Circumference --      Peak Flow --      Pain Score 09/10/17 2016 7     Pain Loc --      Pain Edu? --      Excl. in GC? --       Constitutional: Alert and oriented. Well appearing and in no acute distress. Eyes: Conjunctivae are normal. PERRL. EOMI. Head: Atraumatic. Neck: No stridor.    Cardiovascular: Normal rate, regular rhythm. Normal S1 and S2.  Good peripheral circulation. Respiratory: Normal respiratory effort without tachypnea or retractions. Lungs CTAB. Good air entry to the bases with no decreased or absent breath sounds. Musculoskeletal: Full range of motion to all extremities. No gross deformities appreciated.  Mild ecchymosis noted to the dorsal foot over the talar region.  No deformity.  Full range of motion to the ankle joint.  Full range of motion all 5 digits.  Patient is tender to palpation along the proximal metatarsal bones.  No specific palpable abnormality is appreciated.  Dorsalis pedis pulse intact.  Sensation intact all 5 digits. Neurologic:  Normal speech and language. No gross focal neurologic deficits are appreciated.  Skin:  Skin is warm, dry and intact. No rash noted. Psychiatric: Mood and affect are normal. Speech and behavior are normal. Patient exhibits appropriate insight and judgement.   ____________________________________________   LABS (all labs ordered are listed, but only abnormal results are displayed)  Labs Reviewed - No data to display ____________________________________________  EKG   ____________________________________________  RADIOLOGY Festus Barren Dandra Shambaugh, personally viewed and evaluated these images (plain radiographs) as part of my medical decision making, as well as reviewing the written report by the radiologist.  I concur with radiologist finding of no acute osseous abnormality.  Dg Foot Complete Right  Result Date: 09/10/2017 CLINICAL DATA:  47 year old female with a history of right foot injury and pain EXAM: RIGHT FOOT COMPLETE - 3+ VIEW COMPARISON:  None. FINDINGS: No acute displaced fracture identified. No radiopaque foreign body. No focal  soft tissue swelling. Degenerative changes of the interphalangeal joints, midfoot, and hindfoot. IMPRESSION: No acute bony abnormality. Electronically Signed   By: Gilmer Mor D.O.   On: 09/10/2017 20:36    ____________________________________________    PROCEDURES  Procedure(s) performed:    Procedures    Medications - No data to display   ____________________________________________   INITIAL IMPRESSION / ASSESSMENT AND PLAN / ED COURSE  Pertinent labs & imaging results that were available during my care of the patient were reviewed by me and considered in my medical decision making (see chart for details).  Review of the [place] CSRS  was performed in accordance of the [facility] prior to dispensing any controlled drugs.     Patient's diagnosis is consistent with foot contusion.  Patient presents with right foot pain after dropping a heavy table in the right foot.  Exam is reassuring.  X-ray reveals no acute osseous abnormality.. Patient will be discharged home with prescriptions for Ultram and prednisone. Patient is to follow up with primary care as needed or otherwise directed. Patient is given ED precautions to return to the ED for any worsening or new symptoms.     ____________________________________________  FINAL CLINICAL IMPRESSION(S) / ED DIAGNOSES  Final diagnoses:  Contusion of right foot, initial encounter      NEW MEDICATIONS STARTED DURING THIS VISIT:  ED Discharge Orders        Ordered    predniSONE (DELTASONE) 50 MG tablet  Daily with breakfast     09/10/17 2140    traMADol (ULTRAM) 50 MG tablet  Every 6 hours PRN     09/10/17 2140          This chart was dictated using voice recognition software/Dragon. Despite best efforts to proofread, errors can occur which can change the meaning. Any change was purely unintentional.    Racheal PatchesCuthriell, Jahyra Sukup D, PA-C 09/10/17 2225    Phineas SemenGoodman, Gray[place], MD 09/11/17 [phone]

## 2017-10-12 IMAGING — CT CT ABD-PELV W/ CM
2 of 5 series · 16 of 46 positions shown, 18 images · IV contrast (APPLIED)
Comparison: CT scan 08/22/2011

CLINICAL DATA: Severe abdominal pain, diarrhea

EXAM:
CT ABDOMEN AND PELVIS WITH CONTRAST
TECHNIQUE: Multidetector CT imaging of the abdomen and pelvis was performed
using the standard protocol following bolus administration of
intravenous contrast.
CONTRAST:  100mL ZC8UP0-GEE IOPAMIDOL (ZC8UP0-GEE) INJECTION 61%

[Series 2: axial st · axial · 0.66mm/px · z∈[-459,-79]mm · 13 of 86 slices shown, 15 images]
[im 5/86  soft-tissue]
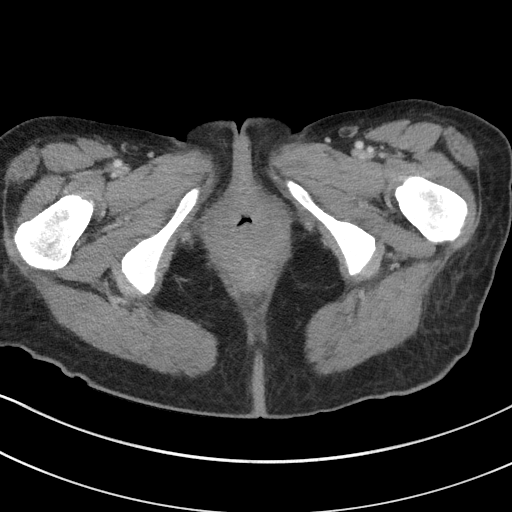
[im 5/86  bone]
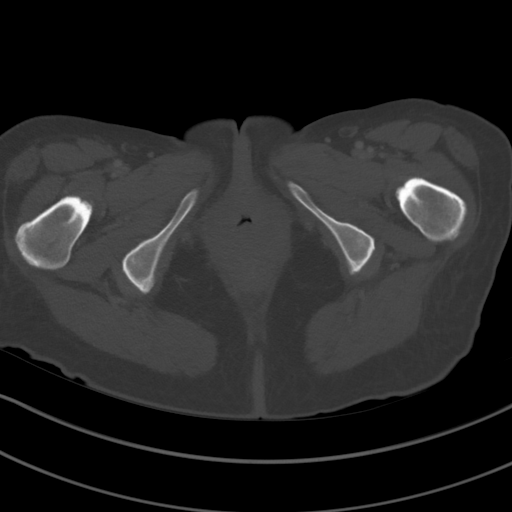
[im 14/86  soft-tissue]
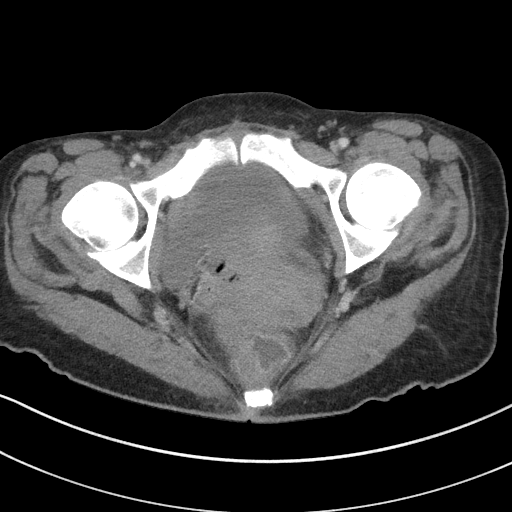
[im 18/86  soft-tissue]
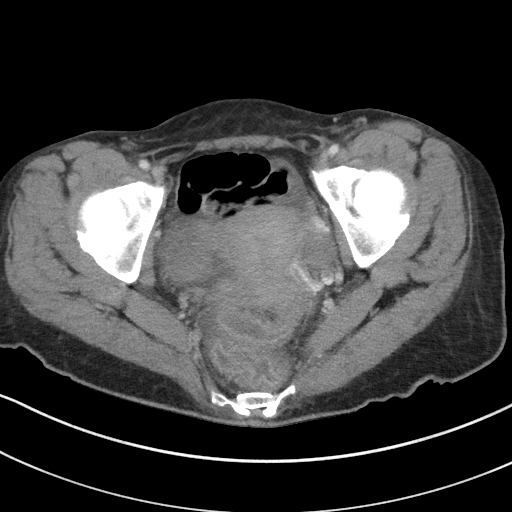
[im 23/86  soft-tissue]
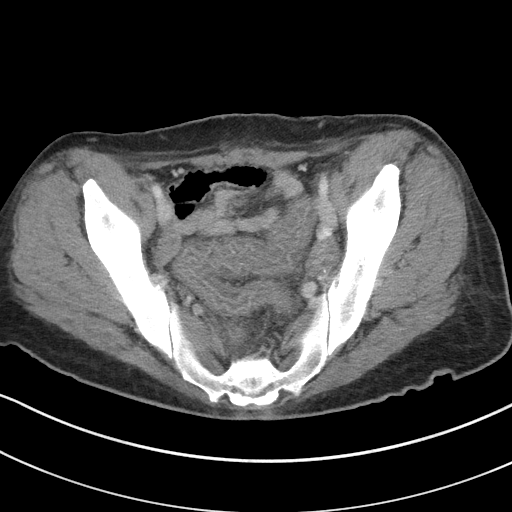
[im 32/86  soft-tissue]
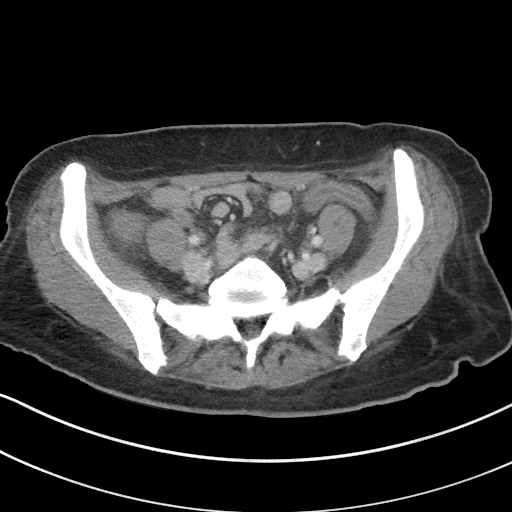
[im 36/86  soft-tissue]
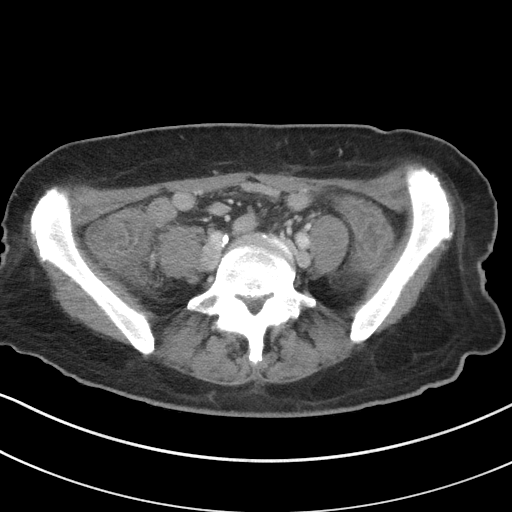
[im 45/86  soft-tissue]
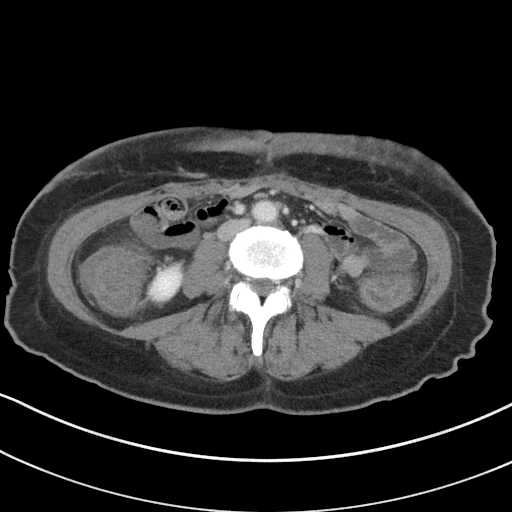
[im 50/86  soft-tissue]
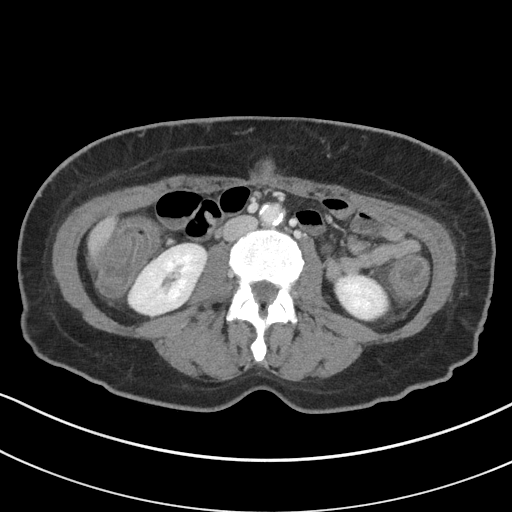
[im 54/86  soft-tissue]
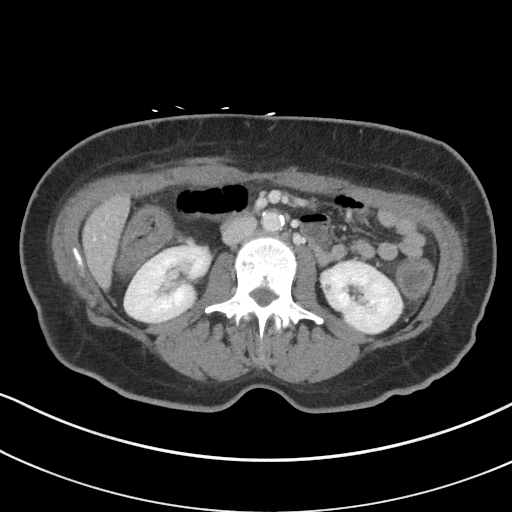
[im 54/86  bone]
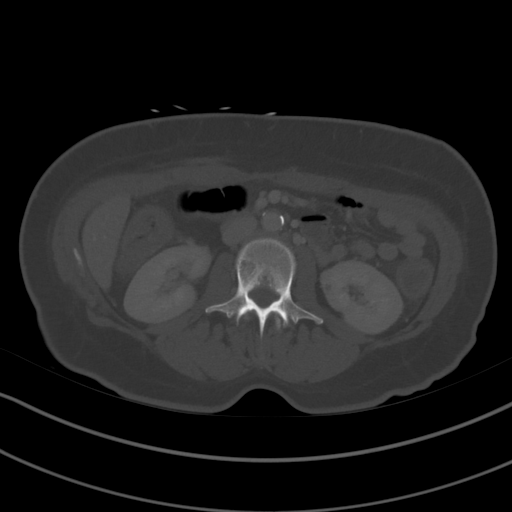
[im 63/86  soft-tissue]
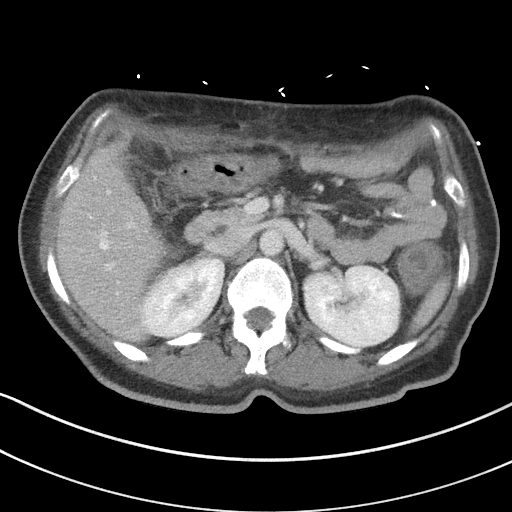
[im 68/86  soft-tissue]
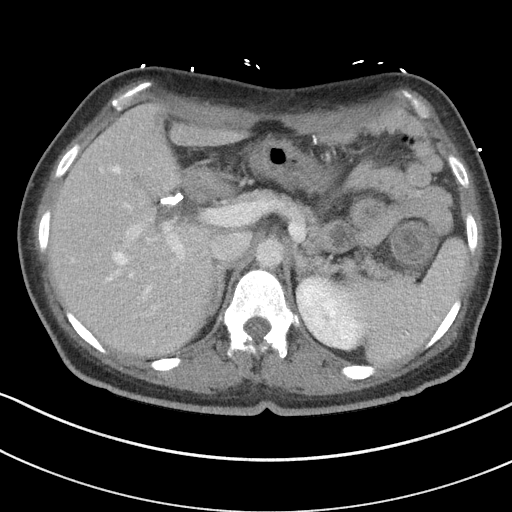
[im 72/86  soft-tissue]
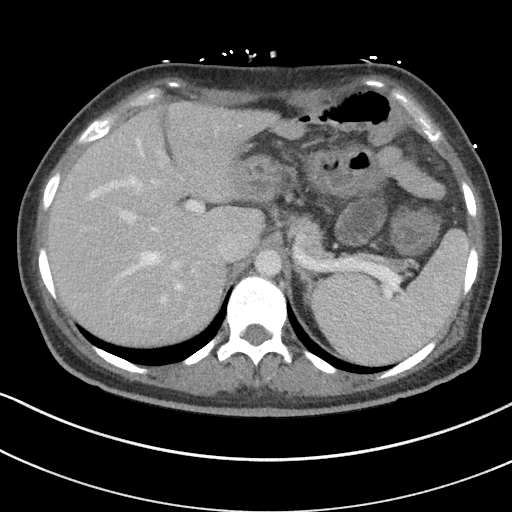
[im 81/86  soft-tissue]
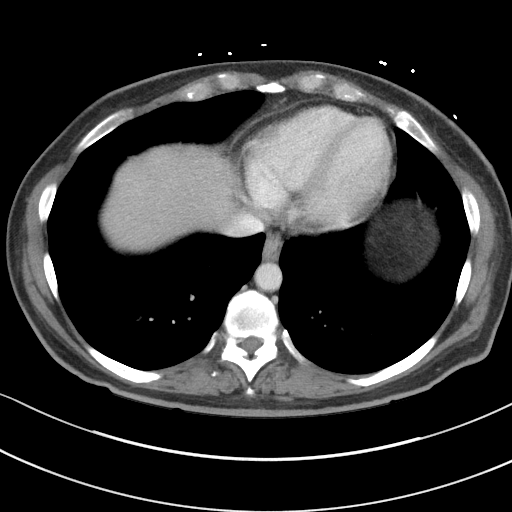

[Series 5: coronal st · coronal · 0.68mm/px · 3 of 82 slices shown]
[im 28/82  soft-tissue]
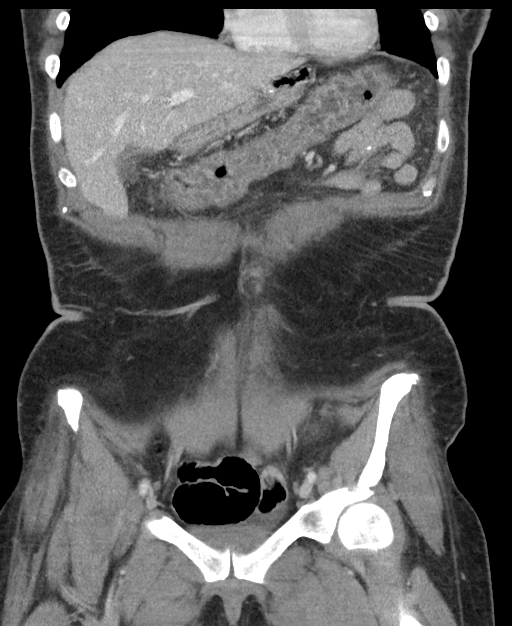
[im 37/82  soft-tissue]
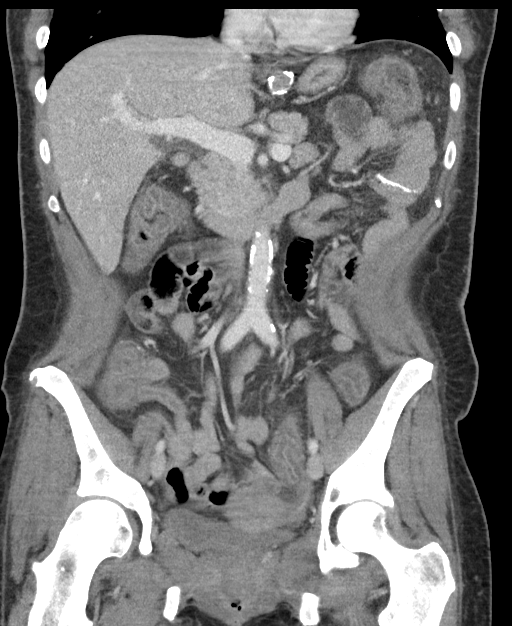
[im 46/82  soft-tissue]
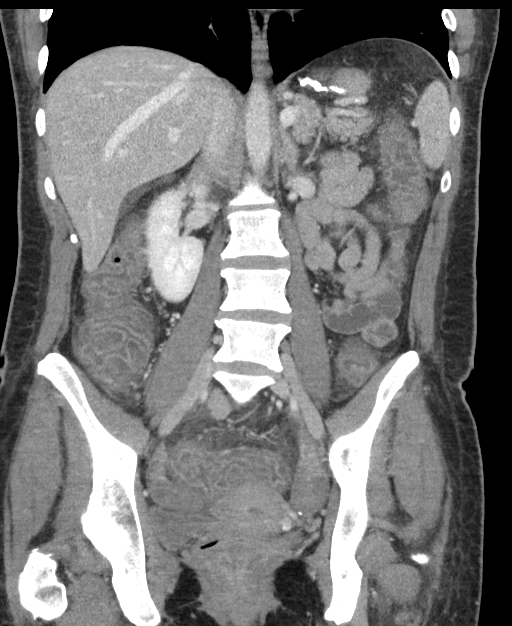

[16 of 46 positions shown; findings below may reference images not displayed]

FINDINGS: Lower chest: The lung bases are unremarkable.  Small hiatal hernia.

Hepatobiliary: Enhanced liver is unremarkable. Status
postcholecystectomy.

Pancreas: Enhanced pancreas is unremarkable.

Spleen: Normal in size without focal abnormality.

Adrenals/Urinary Tract: No adrenal gland mass. No hydronephrosis or
hydroureter. Delayed renal images shows bilateral renal symmetrical
excretion. Bilateral visualized proximal ureter is unremarkable.

Stomach/Bowel: No small bowel obstruction. There is diffuse
thickening of colonic wall and enhancement of the mucosa in right
colon transverse colon descending colon and rectosigmoid colon.
Findings are consistent with diffuse colitis. Clostridium difficile
colitis is a clinical consideration. No pericolonic abscess or
perforation. No colonic obstruction.

Status post gastric bypass surgery.

Vascular/Lymphatic: No retroperitoneal or mesenteric adenopathy. No
aortic aneurysm.

Reproductive: The uterus is anteflexed. No adnexal mass. Minimal
congested left adnexal vessels.

Other: No ascites or free abdominal air. There is mild deep tanning
of the skin in umbilical region. Mild cellulitis cannot be excluded.

Musculoskeletal: No destructive bony lesions are noted. Sagittal
images of the spine shows mild degenerative changes thoracolumbar
spine.
IMPRESSION: 1. There is diffuse thickening of colonic wall and enhancement of
the mucosa consistent with diffuse colitis. No pericolonic abscess
or perforation is noted.
2. No small bowel obstruction.
3. No hydronephrosis or hydroureter.
4. Status post gastric bypass surgery.
5. Minimal congested left adnexal vessels.  No adnexal mass.

## 2018-02-03 ENCOUNTER — Other Ambulatory Visit: Payer: Self-pay | Admitting: Gastroenterology

## 2018-02-03 DIAGNOSIS — A0472 Enterocolitis due to Clostridium difficile, not specified as recurrent: Secondary | ICD-10-CM

## 2018-12-25 ENCOUNTER — Other Ambulatory Visit: Payer: Self-pay | Admitting: Gastroenterology

## 2018-12-25 DIAGNOSIS — A0472 Enterocolitis due to Clostridium difficile, not specified as recurrent: Secondary | ICD-10-CM

## 2019-05-18 IMAGING — CR DG FOOT COMPLETE 3+V*R*
1 series · 3 of 3 positions shown · non-contrast
Comparison: None.

CLINICAL DATA: 47-year-old female with a history of right foot
injury and pain

EXAM:
RIGHT FOOT COMPLETE - 3+ VIEW

[Series 1: x foot ap right · 0.14mm/px · 3 of 3 slices shown]
[im 1/3]
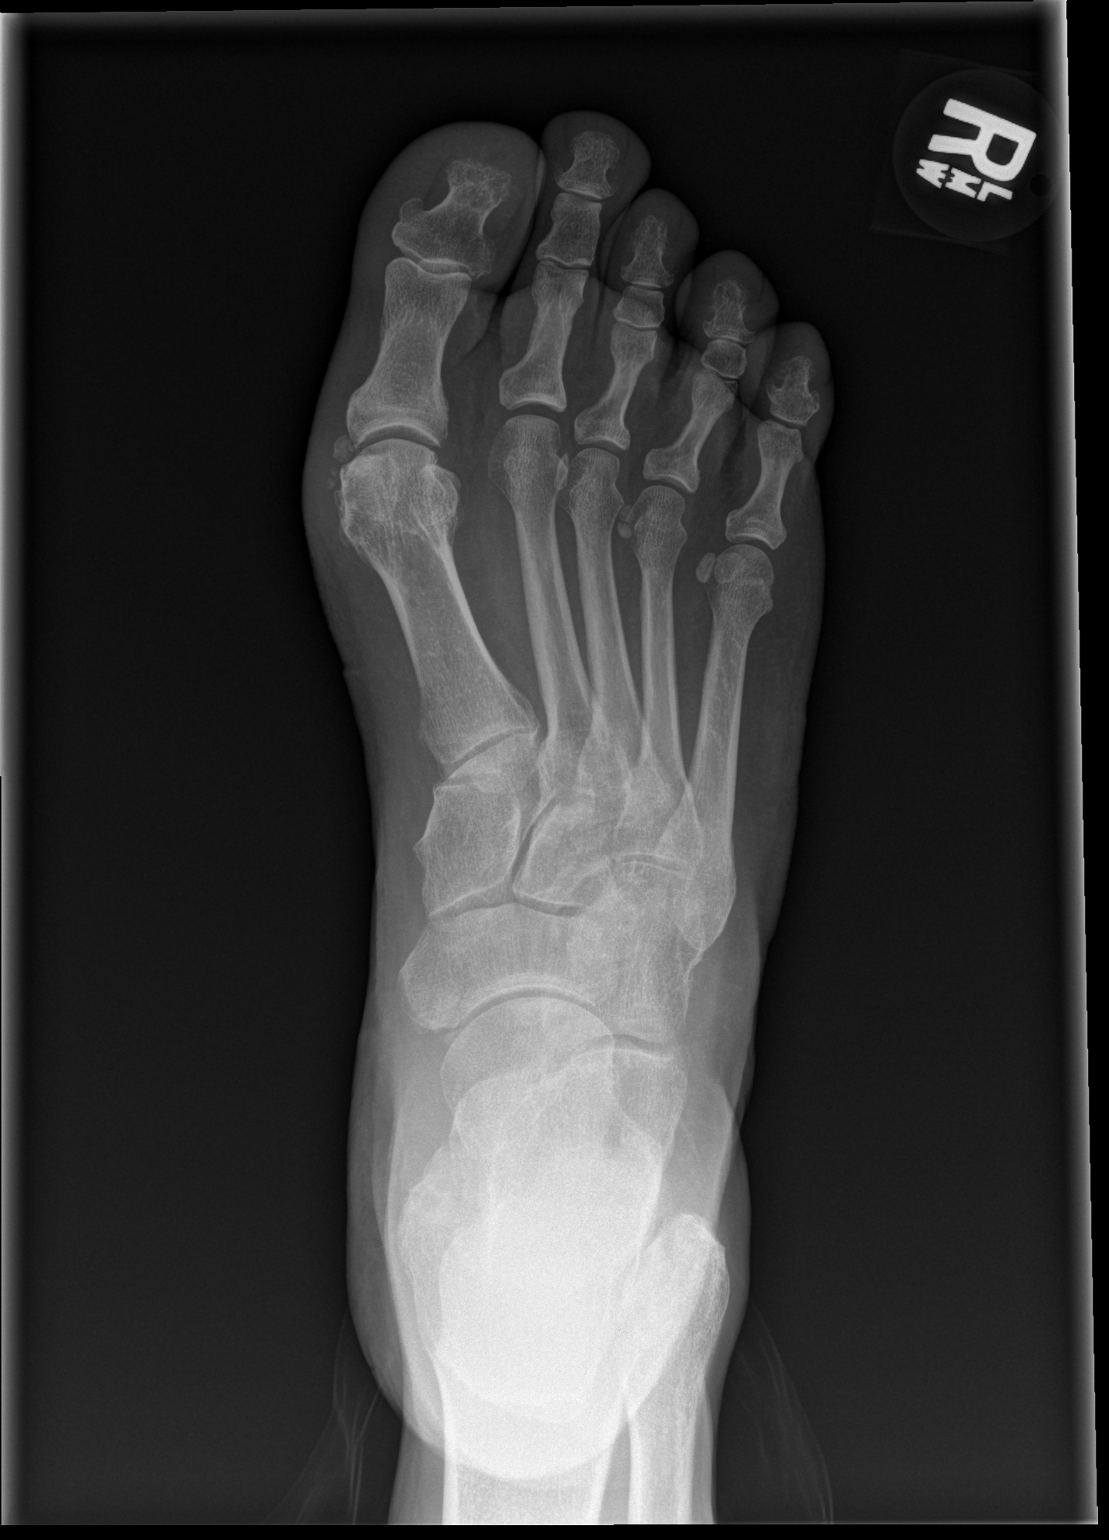
[im 2/3]
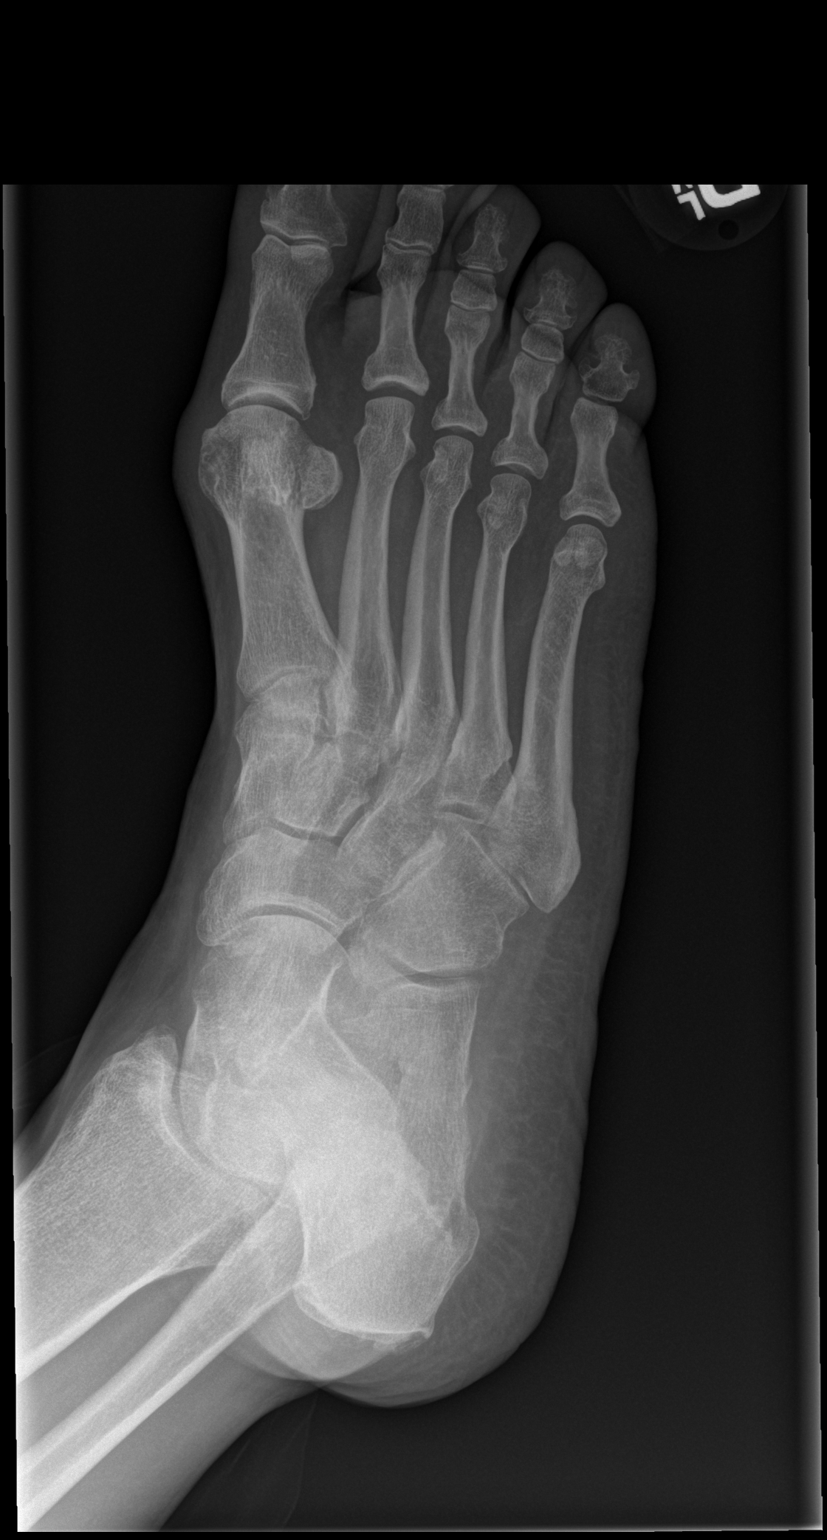
[im 3/3]
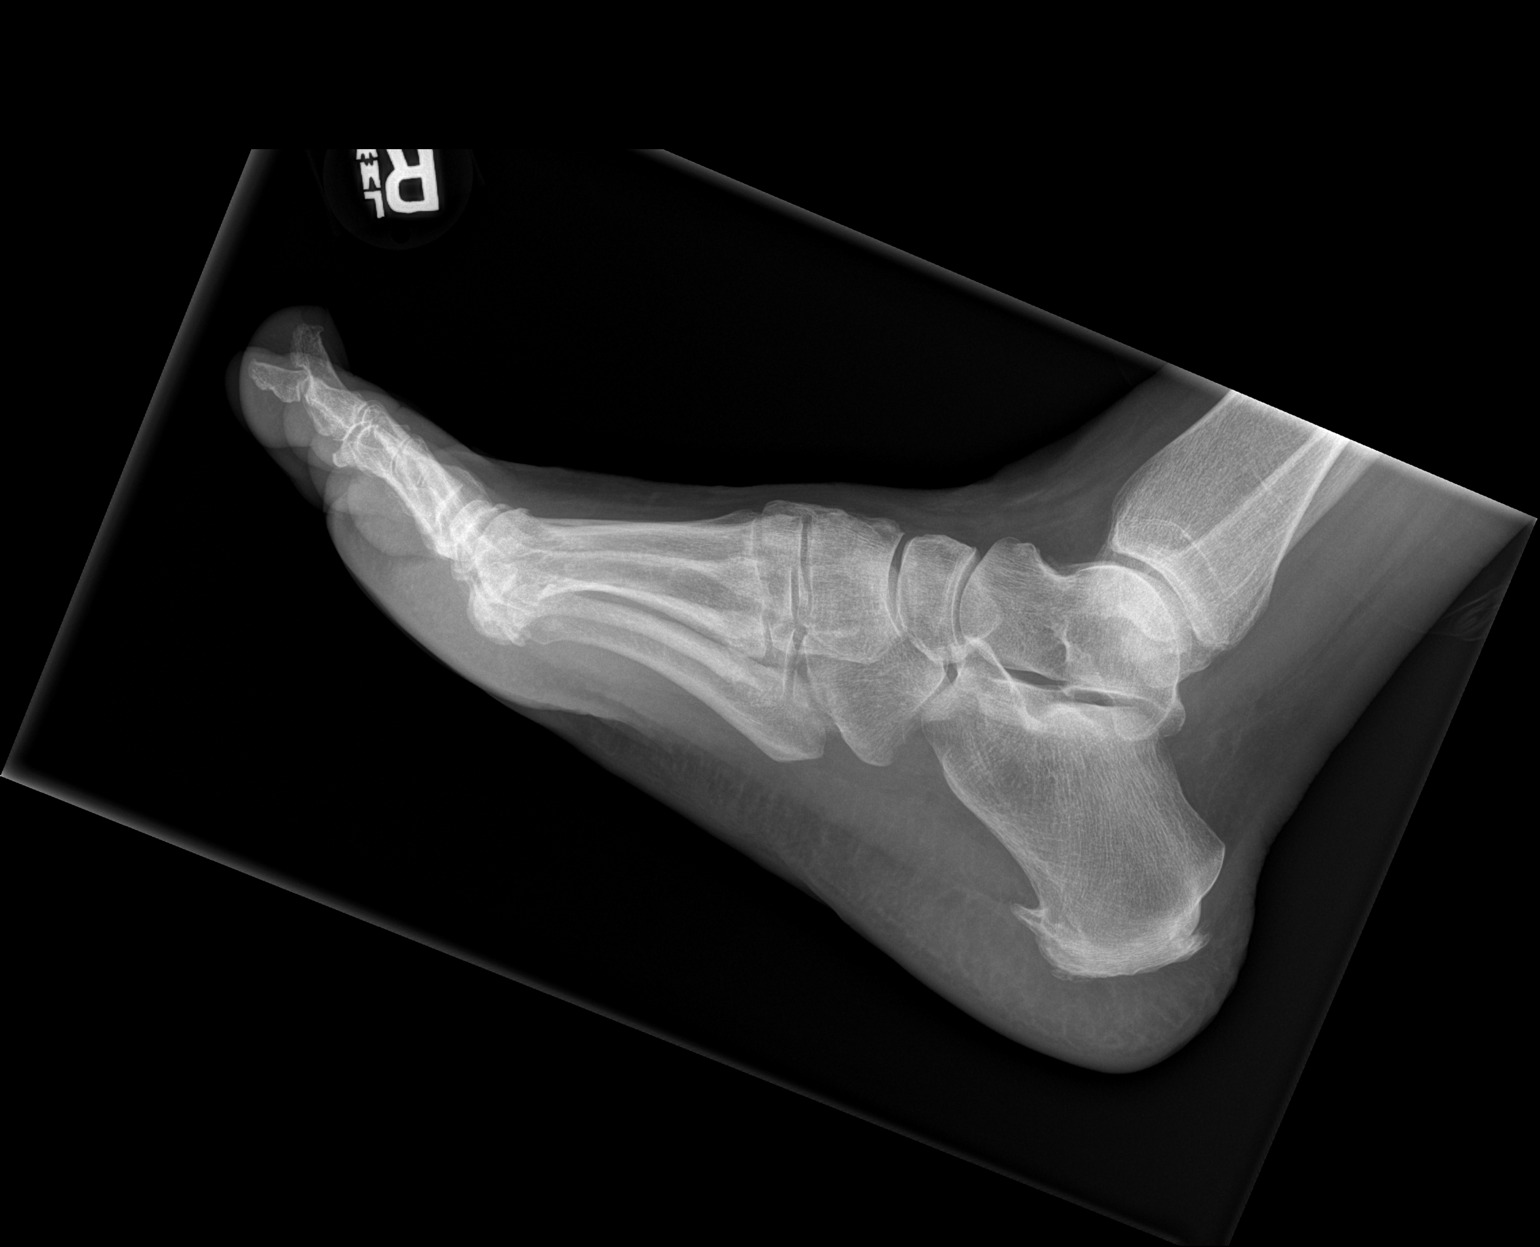

[3 of 3 positions shown; findings below may reference images not displayed]

FINDINGS: No acute displaced fracture identified. No radiopaque foreign body.
No focal soft tissue swelling.

Degenerative changes of the interphalangeal joints, midfoot, and
hindfoot.
IMPRESSION: No acute bony abnormality.

## 2019-09-07 ENCOUNTER — Ambulatory Visit: Payer: Self-pay | Attending: Internal Medicine

## 2019-09-07 DIAGNOSIS — Z20822 Contact with and (suspected) exposure to covid-19: Secondary | ICD-10-CM

## 2019-09-09 LAB — SARS-COV-2, NAA 2 DAY TAT

## 2019-09-09 LAB — NOVEL CORONAVIRUS, NAA: SARS-CoV-2, NAA: NOT DETECTED

## 2021-04-12 ENCOUNTER — Emergency Department: Payer: Self-pay

## 2021-04-12 ENCOUNTER — Emergency Department
Admission: EM | Admit: 2021-04-12 | Discharge: 2021-04-12 | Disposition: A | Discharge status: Home / Self Care | Payer: Self-pay | Attending: Emergency Medicine | Admitting: Emergency Medicine

## 2021-04-12 ENCOUNTER — Other Ambulatory Visit: Payer: Self-pay

## 2021-04-12 DIAGNOSIS — R079 Chest pain, unspecified: Secondary | ICD-10-CM | POA: Insufficient documentation

## 2021-04-12 DIAGNOSIS — Z5321 Procedure and treatment not carried out due to patient leaving prior to being seen by health care provider: Secondary | ICD-10-CM | POA: Insufficient documentation

## 2021-04-12 DIAGNOSIS — Z20822 Contact with and (suspected) exposure to covid-19: Secondary | ICD-10-CM | POA: Insufficient documentation

## 2021-04-12 LAB — BASIC METABOLIC PANEL
Anion gap: 11 (ref 5–15)
BUN: 7 mg/dL (ref 6–20)
CO2: 24 mmol/L (ref 22–32)
Calcium: 9.2 mg/dL (ref 8.9–10.3)
Chloride: 104 mmol/L (ref 98–111)
Creatinine, Ser: 0.59 mg/dL (ref 0.44–1.00)
GFR, Estimated: >60 mL/min (ref >60)
Glucose, Bld: 114 mg/dL — ABNORMAL HIGH (ref 70–99)
Potassium: 3.1 mmol/L — ABNORMAL LOW (ref 3.5–5.1)
Sodium: 139 mmol/L (ref 135–145)

## 2021-04-12 LAB — CBC
HCT: 40.6 % (ref 36.0–46.0)
Hemoglobin: 12.9 g/dL (ref 12.0–15.0)
MCH: 22.9 pg — ABNORMAL LOW (ref 26.0–34.0)
MCHC: 31.8 g/dL (ref 30.0–36.0)
MCV: 72.1 fL — ABNORMAL LOW (ref 80.0–100.0)
Platelets: 343 10*3/uL (ref 150–400)
RBC: 5.63 MIL/uL — ABNORMAL HIGH (ref 3.87–5.11)
RDW: 14.4 % (ref 11.5–15.5)
WBC: 3.9 10*3/uL — ABNORMAL LOW (ref 4.0–10.5)
nRBC: 0 % (ref 0.0–0.2)

## 2021-04-12 LAB — RESP PANEL BY RT-PCR (FLU A&B, COVID) ARPGX2
Influenza A by PCR: NEGATIVE
Influenza B by PCR: NEGATIVE
SARS Coronavirus 2 by RT PCR: NEGATIVE

## 2021-04-12 LAB — HEPATIC FUNCTION PANEL
ALT: 24 U/L (ref 0–44)
AST: 37 U/L (ref 15–41)
Albumin: 4.5 g/dL (ref 3.5–5.0)
Alkaline Phosphatase: 98 U/L (ref 38–126)
Bilirubin, Direct: <0.1 mg/dL (ref 0.0–0.2)
Total Bilirubin: 0.5 mg/dL (ref 0.3–1.2)
Total Protein: 8.1 g/dL (ref 6.5–8.1)

## 2021-04-12 LAB — TROPONIN I (HIGH SENSITIVITY): Troponin I (High Sensitivity): 3 ng/L (ref <18)

## 2021-04-12 NOTE — ED Provider Notes (Signed)
Emergency department brief note  Patient eloped from the emergency department prior to my evaluation.  Despite signing up for this patient, I did not see or evaluate her prior to her elopement.   Merwyn Katos, MD 04/12/21 [phone]

## 2021-04-12 NOTE — ED Notes (Signed)
Pt called out and is angry that she has not seen a dr or gotten pain meds. RN notified provider of pt complaint and need.e

## 2021-04-12 NOTE — ED Provider Notes (Signed)
Emergency Medicine Provider Triage Evaluation Note  Abigail Kim , a 50 y.o. female  was evaluated in triage.  Pt complains of chest pain.  Arrived via EMS.  Pain started 930 this morning.  Denies shortness of breath.  No cough or congestion.  Patient was given nitro and aspirin.  Review of Systems  Positive: Chest pain, substernal, nonradiating Negative: Fever, chills, cough/congestion, shortness of breath  Physical Exam  BP (!) 177/104 (BP Location: Right Arm) Comment: Pt states she has taken her BP medication today, but she does suffer from HTN  Pulse 71   Temp 98.4 F (36.9 C) (Oral)   Resp 17   SpO2 98%  Gen:   Awake, no distress   Resp:  Normal effort  MSK:   Moves extremities without difficulty  Other:    Medical Decision Making  Medically screening exam initiated at 11:20 AM.  Appropriate orders placed.  Abigail Kim was informed that the remainder of the evaluation will be completed by another provider, this initial triage assessment does not replace that evaluation, and the importance of remaining in the ED until their evaluation is complete.  EKG ordered   Faythe Ghee, PA-C 04/12/21 1121    Merwyn Katos, MD 04/12/21 [phone]

## 2021-04-12 NOTE — ED Triage Notes (Signed)
Pt comes into the ED via EMS from home with c/o chest pain that started around 930am this morning,  Denies SOB 158/98 Nitro spray x1 ASA 324mg  82HR 96%RA [id number]

## 2021-04-12 NOTE — ED Notes (Signed)
Pt left ED at this time. Pt encouraged to wait for provider, but pt reported she wanted to go and left prior to getting repeat vitals or signing forms.
